# Patient Record
Sex: Female | Born: 1980 | Race: Black or African American | Hispanic: No | Marital: Single | Smoking: Never smoker
Health system: Southern US, Community
[De-identification: ages and names within clinical notes are randomized; demographics above are authoritative.]

## PROBLEM LIST (undated history)

## (undated) DIAGNOSIS — R569 Unspecified convulsions: Secondary | ICD-10-CM

---

## 2017-04-07 ENCOUNTER — Emergency Department (HOSPITAL_COMMUNITY)
Admission: EM | Admit: 2017-04-07 | Discharge: 2017-04-07 | Disposition: A | Discharge status: Home / Self Care | Payer: Medicare Other | Attending: Emergency Medicine | Admitting: Emergency Medicine

## 2017-04-07 ENCOUNTER — Emergency Department (HOSPITAL_COMMUNITY): Payer: Medicare Other

## 2017-04-07 DIAGNOSIS — Z791 Long term (current) use of non-steroidal anti-inflammatories (NSAID): Secondary | ICD-10-CM | POA: Diagnosis not present

## 2017-04-07 DIAGNOSIS — Z79899 Other long term (current) drug therapy: Secondary | ICD-10-CM | POA: Insufficient documentation

## 2017-04-07 DIAGNOSIS — R569 Unspecified convulsions: Secondary | ICD-10-CM | POA: Insufficient documentation

## 2017-04-07 LAB — CBC WITH DIFFERENTIAL/PLATELET
BASOS ABS: 0 10*3/uL (ref 0.0–0.1)
Basophils Relative: 0 %
Eosinophils Absolute: 0.3 10*3/uL (ref 0.0–0.7)
Eosinophils Relative: 5 %
HEMATOCRIT: 38.1 % (ref 36.0–46.0)
HEMOGLOBIN: 12 g/dL (ref 12.0–15.0)
LYMPHS ABS: 1.6 10*3/uL (ref 0.7–4.0)
LYMPHS PCT: 25 %
MCH: 22.1 pg — ABNORMAL LOW (ref 26.0–34.0)
MCHC: 31.5 g/dL (ref 30.0–36.0)
MCV: 70 fL — ABNORMAL LOW (ref 78.0–100.0)
Monocytes Absolute: 0.4 10*3/uL (ref 0.1–1.0)
Monocytes Relative: 6 %
NEUTROS ABS: 4 10*3/uL (ref 1.7–7.7)
Neutrophils Relative %: 64 %
Platelets: 249 10*3/uL (ref 150–400)
RBC: 5.44 MIL/uL — AB (ref 3.87–5.11)
RDW: 16 % — ABNORMAL HIGH (ref 11.5–15.5)
WBC: 6.2 10*3/uL (ref 4.0–10.5)

## 2017-04-07 LAB — COMPREHENSIVE METABOLIC PANEL
ALK PHOS: 72 U/L (ref 38–126)
ALT: 24 U/L (ref 14–54)
AST: 28 U/L (ref 15–41)
Albumin: 3.3 g/dL — ABNORMAL LOW (ref 3.5–5.0)
Anion gap: 5 (ref 5–15)
BILIRUBIN TOTAL: 0.5 mg/dL (ref 0.3–1.2)
BUN: 9 mg/dL (ref 6–20)
CALCIUM: 8.6 mg/dL — AB (ref 8.9–10.3)
CHLORIDE: 107 mmol/L (ref 101–111)
CO2: 23 mmol/L (ref 22–32)
CREATININE: 0.55 mg/dL (ref 0.44–1.00)
Glucose, Bld: 92 mg/dL (ref 65–99)
Potassium: 3.7 mmol/L (ref 3.5–5.1)
Sodium: 135 mmol/L (ref 135–145)
Total Protein: 6.4 g/dL — ABNORMAL LOW (ref 6.5–8.1)

## 2017-04-07 LAB — PHENOBARBITAL LEVEL: Phenobarbital: 21.2 ug/mL (ref 15.0–30.0)

## 2017-04-07 LAB — I-STAT BETA HCG BLOOD, ED (MC, WL, AP ONLY)

## 2017-04-07 LAB — CBG MONITORING, ED: GLUCOSE-CAPILLARY: 81 mg/dL (ref 65–99)

## 2017-04-07 MED ORDER — GABAPENTIN 300 MG PO CAPS
300.0000 mg | ORAL_CAPSULE | Freq: Once | ORAL | Status: AC
  Administered 2017-04-07: 300 mg via ORAL
  Filled 2017-04-07: qty 1

## 2017-04-07 MED ORDER — LORAZEPAM 2 MG/ML IJ SOLN
4.0000 mg | Freq: Once | INTRAMUSCULAR | Status: AC
  Administered 2017-04-07: 4 mg via INTRAVENOUS

## 2017-04-07 MED ORDER — SODIUM CHLORIDE 0.9 % IV BOLUS (SEPSIS)
1000.0000 mL | Freq: Once | INTRAVENOUS | Status: AC
  Administered 2017-04-07: 1000 mL via INTRAVENOUS

## 2017-04-07 MED ORDER — LORAZEPAM 2 MG/ML IJ SOLN
2.0000 mg | Freq: Once | INTRAMUSCULAR | Status: AC
  Administered 2017-04-07: 2 mg via INTRAVENOUS

## 2017-04-07 MED ORDER — ACETAMINOPHEN 325 MG PO TABS
650.0000 mg | ORAL_TABLET | Freq: Once | ORAL | Status: AC
  Administered 2017-04-07: 650 mg via ORAL
  Filled 2017-04-07: qty 2

## 2017-04-07 NOTE — ED Notes (Signed)
Patient was riding in a car when seizure started.  Lasted for approx 30 min prior to arrival.  Hx of sarcoidosis and seizures on meds for such.  Friends/family endorse increased swelling to brain from prior doctors visit.  Patient was shaking and having seizure proir to admin of 6mg  ativan with D. Liu at bedside.  Patient is now able to respond in short sentences with 2L nasal cannula on.  Not confused at baseline.

## 2017-04-07 NOTE — Consult Note (Signed)
Neurology Consultation Reason for Consult: Seizures Referring Physician: Verdie Mosher, D  CC: Seizures  History is obtained from: Patient, family  HPI: Joann Newman is a 36 y.o. female with a history of epilepsy as well as nonepileptic seizures who presents with recurrent seizures that happened earlier today. He reports that she has between 1 and 4 seizures per week, and that it is not uncommon for her to have recurrent seizures. When they last longer than 15 minutes as they bring her to the emergency department.  Today, she had recurrent seizures without return to baseline lasting almost a half an hour and therefore they brought her to the emergency department where she had another seizure. She was given Ativan and has since become more cognizant and  Following commands.  Family reports that she seems very much as she typically does and her postictal state, and she describes some left-sided numbness which she says is normal for her after a seizure.  ROS: A 14 point ROS was performed and is negative except as noted in the HPI.   Past medical history): Seizures ? History of pseudoseizures Sarcoidosis Migraines Hypertension  Medications at the time of discharge from [facility] which she reports has not changed(copied from [facility] discahrge summary): amitriptyline (ELAVIL) 25 MG tablet  Indications: Migraine Prevention, headache Take 25-50 mg by mouth nightly.   1 01/10/2017   citalopram (CELEXA) 40 MG tablet  Take 40 mg by mouth daily.  0 03/04/2017   cyclobenzaprine (FLEXERIL) 10 MG tablet  Take 10 mg by mouth Three (3) times a day as needed for muscle spasms.  0 03/05/2017   gabapentin (NEURONTIN) 300 MG capsule  Take 300 mg by mouth Three (3) times a day.  2 03/09/2017   levETIRAcetam (KEPPRA) 1000 MG tablet  Take 2,000 mg by mouth Two (2) times a day. Dose verified with pharmacy  3 03/04/2017   meloxicam (MOBIC) 15 MG tablet  Take 15 mg by mouth daily with breakfast.  0  03/04/2017   rOPINIRole (REQUIP) 1 MG tablet  Take 1 mg by mouth nightly.  2 03/09/2017   VIMPAT 200 mg tablet  Take 200 mg by mouth Two (2) times a day.  3 02/20/2017   PHENobarbital (LUMINAL) 64.8 MG tablet  Take 1 tablet (65 mg total) by mouth Two (2) times a day. 60 tablet  1 03/23/2017 04/22/2017  oxyCODONE-acetaminophen (PERCOCET) 5-325 mg per tablet  Take 1 tablet by mouth Every six (6) hours. for 5 days 15 tablet  0 03/23/2017 03/28/2017     FHx: Seizures   Social History: Recently moved from [place], used to see [facility] for her medical care, recently discharged from [facility]. She has an outpatient neurologist who is still managing her medications.  Exam: Current vital signs: BP 114/76   Pulse 95   Resp 18   LMP  (LMP Unknown) Comment: LMP unknown due to pt being in unconscious state at the time of x-rays  SpO2 100%  Vital signs in last 24 hours: Pulse Rate:  [92-98] 95 (06/24 1545) Resp:  [16-23] 18 (06/24 1730) BP: (84-132)/(35-106) 114/76 (06/24 1730) SpO2:  [99 %-100 %] 100 % (06/24 1545)   Physical Exam  Constitutional: Appears obese Psych: Affect appropriate to situation Eyes: No scleral injection HENT: No OP obstrucion Head: Normocephalic.  Cardiovascular: Normal rate and regular rhythm.  Respiratory: Effort normal and breath sounds normal to anterior ascultation GI: Soft.  No distension. There is no tenderness.  Skin: WDI  Neuro: Mental Status: Patient is  somnolent but easily arousable, no signs of aphasia or neglect.  Cranial Nerves: II: Visual Fields are full. Pupils are equal, round, and reactive to light.   III,IV, VI: EOMI without ptosis or diploplia.  V: Facial sensation is symmetric to temperature VII: Facial movement is symmetric.  VIII: hearing is intact to voice X: Uvula elevates symmetrically XI: Shoulder shrug is symmetric. XII: tongue is midline without atrophy or fasciculations.  Motor: Tone is normal. Bulk is normal. 5/5 strength  was present on the right, possible mild 4+/5 weakness on the left.  Sensory: decrease Cerebellar: FNF  intact bilaterally   I have reviewed labs in epic and the results pertinent to this consultation are: PHB 21.2  Impression: 36 year old female with a history of seizures with breakthrough seizure flurry today. With Ativan, she has ceased her seizure flurry. She has a fairly maximal doses of multiple medications, I would not favor making any changes at this time but would rather have her discuss further medication changes with her primary neurologist.  Recommendations: 1)  no medication changes at this time 2) as long as she continues to improve to baseline, she can be discharged to follow up with her primary neurologist.   Ritta SlotMcNeill Kirkpatrick, MD [facility] [phone]  If 7pm- 7am, please page neurology on call as listed in [facility].

## 2017-04-07 NOTE — ED Notes (Signed)
Patient's realitive Pashun Judkins would like to be called with any change in condition and at discharge/admit  Phone: [phone]

## 2017-04-07 NOTE — Discharge Instructions (Signed)
Please follow-up with Dr. Ninetta LightsMacDonald from [facility] before she returns to [place]. Please continue seizure medications as prescribed. Return for worsening symptoms.

## 2017-04-07 NOTE — ED Notes (Addendum)
Requesting to take home meds which consist of percocet, gabapentin, and celexa.  Asked patient to wait until she gets home to take meds due to drowsiness.

## 2017-04-07 NOTE — ED Provider Notes (Addendum)
7:55 PM Patient is sleepy, easily arousable to verbal stimulus. Gait is unsteady.   Doug SouJacubowitz, Pat Sires, MD 04/07/17 1957 9:10 PM complained of mild diffuse headache. Tylenol ordered.   At 9:50 PM he continues to complain of headache however feels ready to go home. She is alert and ambulates unassisted. She'll be driven home by family member    Doug SouJacubowitz, Candus Braud, MD 04/07/17 2158    Doug SouJacubowitz, Therin Vetsch, MD 04/07/17 2159

## 2017-04-07 NOTE — ED Notes (Signed)
Ambulated in the hall at this time.  Still wobbly on her feet and c/o "black spots".

## 2017-04-07 NOTE — ED Notes (Signed)
Very motivated to walk in the hall at this time. Assisted X 2.  Able to walk abot 10 steps then states "i need to get back in bed before I fall down."

## 2017-04-07 NOTE — ED Notes (Signed)
Attempted to ambulate pt w/ Dr. Shela CommonsJ w/o success.  Pt remains lethargic and cannot maintain her balance.  Will attempt again later.

## 2017-04-07 NOTE — ED Notes (Signed)
Pt continues to be drowsy.

## 2017-04-07 NOTE — ED Provider Notes (Addendum)
[facility]-EMERGENCY DEPT Provider Note   CSN: [id number] Arrival date & time: 04/07/17  1226     History   Chief Complaint Chief Complaint  Patient presents with  . Seizures    HPI Jacelyn GripJessica Judkins-Frey is a 36 y.o. female.  The history is provided by a relative.   [age] year old female who presents with seizures. History is provided by patient's relative states that while driving in the car 30 minutes prior to arrival she began to have multiple back-to-back seizures lasting for several minutes without clear return to baseline. They report that she was recently [facility]. State that her medications were changed. They do report that she has had increased migraine headaches over the past 2 weeks. They have noticed increasing breakthrough seizures during this time as well. Has had recent diarrhea over the past 1-2 days with mild cough. No current fevers or chills. No nausea or vomiting. She reports that she has been compliant with her medications. Currently visiting from [place] where she receives most of her care.   No past medical history on file.  There are no active problems to display for this patient.   No past surgical history on file.  OB History    No data available       Home Medications    Prior to Admission medications   Medication Sig Start Date End Date Taking? Authorizing Provider  acetaminophen (TYLENOL) 500 MG tablet Take 500-1,000 mg by mouth every 6 (six) hours as needed for headache (pain).   Yes [provider]  albuterol (PROVENTIL HFA;VENTOLIN HFA) 108 (90 Base) MCG/ACT inhaler Inhale 2 puffs into the lungs every 6 (six) hours as needed for wheezing or shortness of breath.   Yes [provider]  amitriptyline (ELAVIL) 25 MG tablet Take 50 mg by mouth at bedtime.   Yes [provider]  Cholecalciferol (VITAMIN D PO) Take 1 tablet by mouth daily.   Yes [provider]    citalopram (CELEXA) 40 MG tablet Take 40 mg by mouth daily at 12 noon.   Yes [provider]  diclofenac (VOLTAREN) 50 MG EC tablet Take 50 mg by mouth every 8 (eight) hours as needed (pain).   Yes [provider]  gabapentin (NEURONTIN) 300 MG capsule Take 300 mg by mouth 3 (three) times daily.   Yes [provider]  ibuprofen (ADVIL,MOTRIN) 200 MG tablet Take 200-400 mg by mouth every 6 (six) hours as needed for headache (pain).   Yes [provider]  lacosamide (VIMPAT) 200 MG TABS tablet Take 200 mg by mouth 2 (two) times daily.   Yes [provider]  levETIRAcetam (KEPPRA) 1000 MG tablet Take 2,000 mg by mouth 2 (two) times daily.   Yes [provider]  meloxicam (MOBIC) 15 MG tablet Take 15 mg by mouth daily at 12 noon.   Yes [provider]  oxyCODONE-acetaminophen (PERCOCET/ROXICET) 5-325 MG tablet Take 0.25-1 tablets by mouth every 6 (six) hours as needed (pain).   Yes [provider]  PHENobarbital (LUMINAL) 64.8 MG tablet Take 64.8 mg by mouth See admin instructions. Take 1 tablet (64.8 mg) by mouth twice daily - morning and noon   Yes [provider]  rOPINIRole (REQUIP) 1 MG tablet Take 1 mg by mouth at bedtime.   Yes [provider]  VITAMIN E PO Take 1 tablet by mouth daily.   Yes [provider]  ciprofloxacin (CIPRO) 500 MG tablet  Take 500 mg by mouth 2 (two) times daily. #20 filled 03/29/17    [provider]  cyclobenzaprine (FLEXERIL) 10 MG tablet Take 10 mg by mouth 3 (three) times daily as needed for muscle spasms. #30 filled 03/05/17    [provider]  topiramate (TOPAMAX) 50 MG tablet Take 50 mg by mouth 2 (two) times daily.    [provider]    Family History No family history on file.  Social History Social History  Substance Use Topics  . Smoking status: Not on file  . Smokeless tobacco: Not on file  . Alcohol use Not on file      Allergies   Bactrim [sulfamethoxazole-trimethoprim]; Coconut flavor; Mushroom extract complex; and Pomegranate [punica]   Review of Systems Review of Systems  Constitutional: Negative for fever.  Cardiovascular: Negative for chest pain.  Musculoskeletal: Negative for neck pain.  Allergic/Immunologic: Negative for immunocompromised state.  Neurological: Positive for seizures and headaches.  Hematological: Does not bruise/bleed easily.  All other systems reviewed and are negative.    Physical Exam Updated Vital Signs BP 114/76   Pulse 95   Resp 18   LMP  (LMP Unknown) Comment: LMP unknown due to pt being in unconscious state at the time of x-rays  SpO2 100%   Physical Exam Physical Exam  Nursing note and vitals reviewed. Constitutional: Sedated, listless, arouses to voice and touch, answers simple questions appropriately Head: Normocephalic and atraumatic.  Mouth/Throat: Oropharynx is clear and moist. Eyes: pupils 4 mm symmetric, reactive to light Neck: Normal range of motion. Neck supple. No nuchal rigidity  Cardiovascular: Normal rate and regular rhythm.   Pulmonary/Chest: Effort normal and breath sounds normal.  Abdominal: Soft. There is no tenderness. There is no rebound and no guarding.  Musculoskeletal: Normal range of motion. no deformities Neurological: Somnolent, arouses to voice, answers simple questions appropriately, no facial droop, fluent speech, moves all extremities symmetrically, symmetric bilateral handgrip and bilateral ankle dorsiflexion and plantarflexion. Sensation to light touch grossly in tact. Skin: Skin is warm and dry.  Psychiatric: Cooperative   ED Treatments / Results  Labs (all labs ordered are listed, but only abnormal results are displayed) Labs Reviewed  CBC WITH DIFFERENTIAL/PLATELET - Abnormal; Notable for the following:       Result Value   RBC 5.44 (*)    MCV 70.0 (*)    MCH 22.1 (*)    RDW 16.0 (*)    All other components  within normal limits  COMPREHENSIVE METABOLIC PANEL - Abnormal; Notable for the following:    Calcium 8.6 (*)    Total Protein 6.4 (*)    Albumin 3.3 (*)    All other components within normal limits  PHENOBARBITAL LEVEL  URINALYSIS, ROUTINE W REFLEX MICROSCOPIC  CBG MONITORING, ED  I-STAT BETA HCG BLOOD, ED ([facility], [facility], [facility] ONLY)    EKG  EKG Interpretation  Date/Time:  Sunday April 07 2017 12:28:23 EDT Ventricular Rate:  97 PR Interval:    QRS Duration: 80 QT Interval:  361 QTC Calculation: 459 R Axis:   46 Text Interpretation:  Sinus rhythm Borderline T wave abnormalities no previous EKG  Confirmed by Crista Curb [phone]) on 04/07/2017 2:34:12 PM       Radiology Dg Chest Portable 1 View  Result Date: 04/07/2017 CLINICAL DATA:  Cough and fever EXAM: PORTABLE CHEST 1 VIEW COMPARISON:  None available FINDINGS: Very low lung volumes with basilar atelectasis. Heart appears enlarged with central vascular congestion but suspect this is secondary  to poor inspiration. Negative for edema, large effusion or pneumothorax. Trachea is midline. No osseous abnormality. IMPRESSION: Low volume exam with basilar atelectasis. Electronically Signed   By: Judie Petit.  Shick M.D.   On: 04/07/2017 13:38    Procedures Procedures (including critical care time)  Medications Ordered in ED Medications  gabapentin (NEURONTIN) capsule 300 mg (not administered)  LORazepam (ATIVAN) injection 2 mg (2 mg Intravenous Given 04/07/17 1232)  LORazepam (ATIVAN) injection 4 mg (4 mg Intravenous Given 04/07/17 1232)  sodium chloride 0.9 % bolus 1,000 mL (1,000 mLs Intravenous New Bag/Given 04/07/17 1321)     Initial Impression / Assessment and Plan / ED Course  I have reviewed the triage vital signs and the nursing notes.  Pertinent labs & imaging results that were available during my care of the patient were reviewed by me and considered in my medical decision making (see chart for details).     Outside records obtained from  [facility] and records reviewed. Patient admitted 6/4 for possible status epilepticus. She was not intubated, but briefly on an Ativan drip. Had a CT head that was unremarkable. I had EEG attempted, but discontinued due to migraine headaches. Seizure medications were changed, and was taken off of Dilantin. She is currently taking Keppra, phenobarbital, and Vimpat. Neurologist is see her at [facility], and there was concern for possible nonepileptic seizures which she has had before. 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  On arrival to ED, patient received 2 mg ativan by nurses with persistent reported seizure like activity. Given additional 4 mg ativan, for tonic clonic like activity. There was urinary incontinence. Subsequently came to and has been very sedated. Answers simple questions appropriately, no focal deficits neurologically. Vital signs stable. Blood work overall reassuring.   Discussed with Dr. Amada Jupiter from neurology. He did not feel that she needs admission for seizures especially given 3-4 breakthrough seizures weekly while she was on medications. To await patient to come out of sedation from ativan, and discharge.   Re-evaluation signed out to Dr. Ethelda Chick. If recurrent seizures, Dr. Amada Jupiter recommended ativan and admission.   Final Clinical Impressions(s) / ED Diagnoses   Final diagnoses:  Seizure [facility])    New Prescriptions New Prescriptions   No medications on file     Lavera Guise, MD 04/07/17 1745    Lavera Guise, MD 04/07/17 [phone]

## 2017-04-07 NOTE — ED Notes (Addendum)
Calls out stating i'm having a reaction to the tylenol.  Reports itching. No S/S of reaction noted.   Requesting IV benadryl.  Dr. Shela CommonsJ made aware.

## 2017-04-13 ENCOUNTER — Emergency Department (HOSPITAL_COMMUNITY)
Admission: EM | Admit: 2017-04-13 | Discharge: 2017-04-14 | Disposition: A | Discharge status: Home / Self Care | Payer: Medicare Other | Source: Home / Self Care | Attending: Emergency Medicine | Admitting: Emergency Medicine

## 2017-04-13 ENCOUNTER — Encounter (HOSPITAL_COMMUNITY): Payer: Self-pay | Admitting: Emergency Medicine

## 2017-04-13 DIAGNOSIS — I10 Essential (primary) hypertension: Secondary | ICD-10-CM | POA: Insufficient documentation

## 2017-04-13 DIAGNOSIS — R600 Localized edema: Secondary | ICD-10-CM | POA: Insufficient documentation

## 2017-04-13 DIAGNOSIS — Z79899 Other long term (current) drug therapy: Secondary | ICD-10-CM | POA: Insufficient documentation

## 2017-04-13 DIAGNOSIS — G40909 Epilepsy, unspecified, not intractable, without status epilepticus: Secondary | ICD-10-CM | POA: Insufficient documentation

## 2017-04-13 DIAGNOSIS — R2243 Localized swelling, mass and lump, lower limb, bilateral: Secondary | ICD-10-CM | POA: Diagnosis present

## 2017-04-13 DIAGNOSIS — R569 Unspecified convulsions: Secondary | ICD-10-CM

## 2017-04-13 HISTORY — DX: Unspecified convulsions: R56.9

## 2017-04-13 NOTE — ED Provider Notes (Signed)
[facility]-EMERGENCY DEPT Provider Note   CSN: [id number] Arrival date & time: 04/13/17  2357  By signing my name below, I, [name], attest that this documentation has been prepared under the direction and in the presence of [name], MD . Electronically Signed: [name], Scribe. 04/14/2017. 12:12 AM.  History   Chief Complaint Chief Complaint  Patient presents with  . Seizures    HPI Comments Joann Newman is a 36 y.o. female with a PMHx significant for HTN and epilepsy, possible status epilepticus, who presents to the Emergency Department actively seizing.  She was brought from home, reportedly unresponsive on arrival.  She was seen in the ED on 04/07/2017, x1wk ago.  At that time, she reported a medication regimen of Keppra, phenobarbital, and Vimpat, experiencing 3-4 breakthrough seizures weekly.  LEVEL 5 CAVEAT DUE TO PT'S CURRENT CONDITION.   The history is provided by medical records. The history is limited by the condition of the patient. No language interpreter was used.    Past Medical History:  Diagnosis Date  . Seizures (HCC)     There are no active problems to display for this patient.   History reviewed. No pertinent surgical history.  OB History    No data available       Home Medications    Prior to Admission medications   Medication Sig Start Date End Date Taking? Authorizing Provider  acetaminophen (TYLENOL) 500 MG tablet Take 500-1,000 mg by mouth every 6 (six) hours as needed for headache (pain).    [provider]  albuterol (PROVENTIL HFA;VENTOLIN HFA) 108 (90 Base) MCG/ACT inhaler Inhale 2 puffs into the lungs every 6 (six) hours as needed for wheezing or shortness of breath.    [provider]  amitriptyline (ELAVIL) 25 MG tablet Take 50 mg by mouth at bedtime.    [provider]  Cholecalciferol (VITAMIN D PO) Take 1 tablet by mouth daily.    [provider]  ciprofloxacin (CIPRO) 500 MG  tablet Take 500 mg by mouth 2 (two) times daily. #20 filled 03/29/17    [provider]  citalopram (CELEXA) 40 MG tablet Take 40 mg by mouth daily at 12 noon.    [provider]  cyclobenzaprine (FLEXERIL) 10 MG tablet Take 10 mg by mouth 3 (three) times daily as needed for muscle spasms. #30 filled 03/05/17    [provider]  diclofenac (VOLTAREN) 50 MG EC tablet Take 50 mg by mouth every 8 (eight) hours as needed (pain).    [provider]  gabapentin (NEURONTIN) 300 MG capsule Take 300 mg by mouth 3 (three) times daily.    [provider]  ibuprofen (ADVIL,MOTRIN) 200 MG tablet Take 200-400 mg by mouth every 6 (six) hours as needed for headache (pain).    [provider]  lacosamide (VIMPAT) 200 MG TABS tablet Take 200 mg by mouth 2 (two) times daily.    [provider]  levETIRAcetam (KEPPRA) 1000 MG tablet Take 2,000 mg by mouth 2 (two) times daily.    [provider]  meloxicam (MOBIC) 15 MG tablet Take 15 mg by mouth daily at 12 noon.    [provider]  oxyCODONE-acetaminophen (PERCOCET/ROXICET) 5-325 MG tablet Take 0.25-1 tablets by mouth every 6 (six) hours as needed (pain).    [provider]  PHENobarbital (LUMINAL) 64.8 MG tablet Take 64.8 mg by mouth See admin instructions. Take 1 tablet (64.8 mg) by mouth twice daily - morning and noon  [provider]  rOPINIRole (REQUIP) 1 MG tablet Take 1 mg by mouth at bedtime.    [provider]  topiramate (TOPAMAX) 50 MG tablet Take 50 mg by mouth 2 (two) times daily.    [provider]  VITAMIN E PO Take 1 tablet by mouth daily.    [provider]    Family History No family history on file.  Social History Social History  Substance Use Topics  . Smoking status: Not on file  . Smokeless tobacco: Not on file  . Alcohol use Not on file     Allergies   Bactrim [sulfamethoxazole-trimethoprim]; Coconut flavor;  Mushroom extract complex; and Pomegranate [punica]   Review of Systems Review of Systems  Unable to perform ROS: Patient unresponsive   Physical Exam Updated Vital Signs BP (!) 142/92 (BP Location: Left Arm)   Pulse 96   Temp 98.4 F (36.9 C) (Tympanic)   Resp (!) 23   Ht 5\' 7"  (1.702 m)   LMP  (LMP Unknown)   SpO2 98%   Physical Exam  Constitutional: She appears well-developed and well-nourished.  Unresponsive with low amplitude seizure activity present.   HENT:  Head: Normocephalic and atraumatic.  Eyes: Pupils are equal, round, and reactive to light.  Neck: Normal range of motion. Neck supple. No JVD present.  Cardiovascular: Normal rate, regular rhythm and normal heart sounds.   No murmur heard. Pulmonary/Chest: Effort normal and breath sounds normal. She has no wheezes. She has no rales. She exhibits no tenderness.  Abdominal: Soft. Bowel sounds are normal. She exhibits no distension and no mass. There is no tenderness.  Musculoskeletal:  1+ pretibial edema. 2-3+ pedal edema  Lymphadenopathy:    She has no cervical adenopathy.  Neurological:  Unresponsive with low amplitude seizure activity involving hands and face.   Skin: Skin is warm and dry. No rash noted.  Nursing note and vitals reviewed.  ED Treatments / Results  DIAGNOSTIC STUDIES: Oxygen Saturation is 98% on room air, normal by my interpretation.    Labs (all labs ordered are listed, but only abnormal results are displayed) Labs Reviewed  BASIC METABOLIC PANEL - Abnormal; Notable for the following:       Result Value   Potassium 3.3 (*)    Calcium 8.6 (*)    All other components within normal limits  CBC WITH DIFFERENTIAL/PLATELET - Abnormal; Notable for the following:    RBC 5.61 (*)    MCV 70.1 (*)    MCH 21.9 (*)    RDW 15.7 (*)    All other components within normal limits    Procedures Procedures (including critical care time) CRITICAL CARE Performed by: GNFAO,ZHYQMGLICK,Lakara Weiland Total critical care  time: 45 minutes Critical care time was exclusive of separately billable procedures and treating other patients. Critical care was necessary to treat or prevent imminent or life-threatening deterioration. Critical care was time spent personally by me on the following activities: development of treatment plan with patient and/or surrogate as well as nursing, discussions with consultants, evaluation of patient's response to treatment, examination of patient, obtaining history from patient or surrogate, ordering and performing treatments and interventions, ordering and review of laboratory studies, ordering and review of radiographic studies, pulse oximetry and re-evaluation of patient's condition.  Medications Ordered in ED Medications  cyclobenzaprine (FLEXERIL) tablet 10 mg (not administered)  LORazepam (ATIVAN) injection 1 mg (1 mg Intravenous Given 04/14/17 0010)  furosemide (LASIX) injection 20 mg (20 mg Intravenous Given 04/14/17 0655)  traMADol Janean Sark(ULTRAM)  tablet 50 mg (50 mg Oral Given 04/14/17 0703)     Initial Impression / Assessment and Plan / ED Course  I have reviewed the triage vital signs and the nursing notes.  Pertinent lab results that were available during my care of the patient were reviewed by me and considered in my medical decision making (see chart for details).  Patient presented unresponsive with seizure activity. Old records are reviewed, and she had been seen in the ED 1 week ago with seizures, and admitted to [facility] 3 weeks ago with status epilepticus. She is given lorazepam 1 mg intravenously, and seizure has stopped. She remains very somnolent.  Family has arrived and stated seizure started just before they got to the hospital. She was asked to come into hospital because of problems with leg cramps. She is visiting from [place], and had run out of her diclofenac 50 mg 3 times a day, and cyclobenzaprine 10 mg 3 times a day. Apparently, in the past, seizures have been  precipitated by pain from leg cramps, cyclobenzaprine and diclofenac have been effective at preventing that. Will check electrolytes. She will be observed in the ED. When she is no longer somnolent, anticipate sending her home with prescriptions for cyclobenzaprine and diclofenac. Family also states that she has had a harsh cough and they're worried about her having walking pneumonia. She does have history of sarcoidosis.  She was in the emergency department about 5 hours before CT started to wake up. At this point, she is so alert and oriented. She is complaining of pain in her feet from swelling and it is noted that she does have significant edema. She is given new prescriptions for diclofenac and cyclobenzaprine. It is also noted that she is running low on phenobarbital, topiramate, and oxycodone-acetaminophen. She is given prescriptions for 1 month supply of phenobarbital and topiramate, and for 15 oxycodone-acetaminophen. She is given a dose of furosemide here and given a prescription for 2 weeks supply. She is to follow-up with her physician in [place] as in his she returns there.  Final Clinical Impressions(s) / ED Diagnoses   Final diagnoses:  Seizure (HCC)    New Prescriptions New Prescriptions   FUROSEMIDE (LASIX) 20 MG TABLET    Take 1 tablet (20 mg total) by mouth daily.   I personally performed the services described in this documentation, which was scribed in my presence. The recorded information has been reviewed and is accurate.     [name], MD 04/14/17 [phone]

## 2017-04-14 ENCOUNTER — Encounter (HOSPITAL_COMMUNITY): Payer: Self-pay | Admitting: *Deleted

## 2017-04-14 ENCOUNTER — Emergency Department (HOSPITAL_COMMUNITY): Payer: Medicare Other

## 2017-04-14 ENCOUNTER — Emergency Department (HOSPITAL_COMMUNITY)
Admission: EM | Admit: 2017-04-14 | Discharge: 2017-04-14 | Disposition: A | Discharge status: Home / Self Care | Payer: Medicare Other | Attending: Emergency Medicine | Admitting: Emergency Medicine

## 2017-04-14 DIAGNOSIS — R609 Edema, unspecified: Secondary | ICD-10-CM

## 2017-04-14 LAB — CBC WITH DIFFERENTIAL/PLATELET
BASOS ABS: 0 10*3/uL (ref 0.0–0.1)
BASOS PCT: 0 %
BASOS PCT: 0 %
Basophils Absolute: 0 10*3/uL (ref 0.0–0.1)
EOS ABS: 0.3 10*3/uL (ref 0.0–0.7)
EOS ABS: 0.4 10*3/uL (ref 0.0–0.7)
Eosinophils Relative: 6 %
Eosinophils Relative: 6 %
HCT: 37.7 % (ref 36.0–46.0)
HEMATOCRIT: 39.3 % (ref 36.0–46.0)
HEMOGLOBIN: 12.3 g/dL (ref 12.0–15.0)
Hemoglobin: 12.4 g/dL (ref 12.0–15.0)
LYMPHS ABS: 1.3 10*3/uL (ref 0.7–4.0)
LYMPHS ABS: 1.6 10*3/uL (ref 0.7–4.0)
LYMPHS PCT: 22 %
Lymphocytes Relative: 23 %
MCH: 21.9 pg — AB (ref 26.0–34.0)
MCH: 22.5 pg — AB (ref 26.0–34.0)
MCHC: 31.3 g/dL (ref 30.0–36.0)
MCHC: 32.9 g/dL (ref 30.0–36.0)
MCV: 68.3 fL — ABNORMAL LOW (ref 78.0–100.0)
MCV: 70.1 fL — AB (ref 78.0–100.0)
MONO ABS: 0.6 10*3/uL (ref 0.1–1.0)
Monocytes Absolute: 0.7 10*3/uL (ref 0.1–1.0)
Monocytes Relative: 9 %
Monocytes Relative: 10 %
NEUTROS ABS: 3.5 10*3/uL (ref 1.7–7.7)
NEUTROS PCT: 62 %
Neutro Abs: 4.3 10*3/uL (ref 1.7–7.7)
Neutrophils Relative %: 62 %
PLATELETS: 193 10*3/uL (ref 150–400)
Platelets: 205 10*3/uL (ref 150–400)
RBC: 5.52 MIL/uL — ABNORMAL HIGH (ref 3.87–5.11)
RBC: 5.61 MIL/uL — AB (ref 3.87–5.11)
RDW: 15.6 % — AB (ref 11.5–15.5)
RDW: 15.7 % — AB (ref 11.5–15.5)
WBC: 5.7 10*3/uL (ref 4.0–10.5)
WBC: 7 10*3/uL (ref 4.0–10.5)

## 2017-04-14 LAB — COMPREHENSIVE METABOLIC PANEL
ALBUMIN: 3.7 g/dL (ref 3.5–5.0)
ALK PHOS: 75 U/L (ref 38–126)
ALT: 24 U/L (ref 14–54)
ANION GAP: 8 (ref 5–15)
AST: 20 U/L (ref 15–41)
BUN: 8 mg/dL (ref 6–20)
CALCIUM: 8.7 mg/dL — AB (ref 8.9–10.3)
CO2: 27 mmol/L (ref 22–32)
Chloride: 104 mmol/L (ref 101–111)
Creatinine, Ser: 0.48 mg/dL (ref 0.44–1.00)
GFR calc non Af Amer: >60 mL/min (ref >60)
GLUCOSE: 85 mg/dL (ref 65–99)
POTASSIUM: 4 mmol/L (ref 3.5–5.1)
SODIUM: 139 mmol/L (ref 135–145)
TOTAL PROTEIN: 7 g/dL (ref 6.5–8.1)
Total Bilirubin: 0.6 mg/dL (ref 0.3–1.2)

## 2017-04-14 LAB — BRAIN NATRIURETIC PEPTIDE: B Natriuretic Peptide: 5.9 pg/mL (ref 0.0–100.0)

## 2017-04-14 LAB — I-STAT TROPONIN, ED: TROPONIN I, POC: 0 ng/mL (ref 0.00–0.08)

## 2017-04-14 LAB — BASIC METABOLIC PANEL
Anion gap: 8 (ref 5–15)
BUN: 8 mg/dL (ref 6–20)
CO2: 24 mmol/L (ref 22–32)
Calcium: 8.6 mg/dL — ABNORMAL LOW (ref 8.9–10.3)
Chloride: 105 mmol/L (ref 101–111)
Creatinine, Ser: 0.56 mg/dL (ref 0.44–1.00)
GFR calc Af Amer: >60 mL/min (ref >60)
GFR calc non Af Amer: >60 mL/min (ref >60)
Glucose, Bld: 95 mg/dL (ref 65–99)
POTASSIUM: 3.3 mmol/L — AB (ref 3.5–5.1)
SODIUM: 137 mmol/L (ref 135–145)

## 2017-04-14 LAB — I-STAT BETA HCG BLOOD, ED (MC, WL, AP ONLY)

## 2017-04-14 MED ORDER — TOPIRAMATE 50 MG PO TABS: 50.0000 mg | ORAL_TABLET | Freq: Two times a day (BID) | ORAL | 0 refills | Status: DC

## 2017-04-14 MED ORDER — MORPHINE SULFATE (PF) 2 MG/ML IV SOLN
4.0000 mg | Freq: Once | INTRAVENOUS | Status: AC
  Administered 2017-04-14: 4 mg via INTRAVENOUS
  Filled 2017-04-14: qty 2

## 2017-04-14 MED ORDER — ONDANSETRON 4 MG PO TBDP
4.0000 mg | ORAL_TABLET | Freq: Once | ORAL | Status: AC
  Administered 2017-04-14: 4 mg via ORAL
  Filled 2017-04-14: qty 1

## 2017-04-14 MED ORDER — PHENOBARBITAL 64.8 MG PO TABS: 64.8000 mg | ORAL_TABLET | ORAL | 0 refills | Status: AC

## 2017-04-14 MED ORDER — DIPHENHYDRAMINE HCL 50 MG/ML IJ SOLN
25.0000 mg | Freq: Once | INTRAMUSCULAR | Status: AC
  Administered 2017-04-14: 25 mg via INTRAVENOUS
  Filled 2017-04-14: qty 1

## 2017-04-14 MED ORDER — KETOROLAC TROMETHAMINE 30 MG/ML IJ SOLN
30.0000 mg | Freq: Once | INTRAMUSCULAR | Status: AC
  Administered 2017-04-14: 30 mg via INTRAVENOUS
  Filled 2017-04-14: qty 1

## 2017-04-14 MED ORDER — LORAZEPAM 2 MG/ML IJ SOLN
1.0000 mg | Freq: Once | INTRAMUSCULAR | Status: AC
  Administered 2017-04-14: 1 mg via INTRAVENOUS
  Filled 2017-04-14: qty 1

## 2017-04-14 MED ORDER — CYCLOBENZAPRINE HCL 10 MG PO TABS
10.0000 mg | ORAL_TABLET | Freq: Once | ORAL | Status: AC
  Administered 2017-04-14: 10 mg via ORAL
  Filled 2017-04-14: qty 1

## 2017-04-14 MED ORDER — FUROSEMIDE 40 MG PO TABS
20.0000 mg | ORAL_TABLET | Freq: Once | ORAL | Status: AC
  Administered 2017-04-14: 20 mg via ORAL
  Filled 2017-04-14: qty 1

## 2017-04-14 MED ORDER — FUROSEMIDE 20 MG PO TABS: 20.0000 mg | ORAL_TABLET | Freq: Every day | ORAL | 0 refills | Status: AC

## 2017-04-14 MED ORDER — CYCLOBENZAPRINE HCL 10 MG PO TABS: 10.0000 mg | ORAL_TABLET | Freq: Three times a day (TID) | ORAL | 0 refills | Status: AC | PRN

## 2017-04-14 MED ORDER — OXYCODONE-ACETAMINOPHEN 5-325 MG PO TABS: 0.5000 | ORAL_TABLET | Freq: Four times a day (QID) | ORAL | 0 refills | Status: AC | PRN

## 2017-04-14 MED ORDER — DICLOFENAC SODIUM 50 MG PO TBEC: 50.0000 mg | DELAYED_RELEASE_TABLET | Freq: Three times a day (TID) | ORAL | 0 refills | Status: AC | PRN

## 2017-04-14 MED ORDER — FUROSEMIDE 10 MG/ML IJ SOLN
20.0000 mg | Freq: Once | INTRAMUSCULAR | Status: AC
  Administered 2017-04-14: 20 mg via INTRAVENOUS
  Filled 2017-04-14: qty 2

## 2017-04-14 MED ORDER — TRAMADOL HCL 50 MG PO TABS
50.0000 mg | ORAL_TABLET | Freq: Once | ORAL | Status: AC
  Administered 2017-04-14: 50 mg via ORAL
  Filled 2017-04-14: qty 1

## 2017-04-14 NOTE — ED Triage Notes (Addendum)
Pt demonstrating seizure activity with whimpering and drooling, after several seconds turned pt back onto her back. She immediately stopped seizureactivity. Abl to answer questions without slurred speech or difficulty. Pt did not have any rigidity or incontinence during episode.

## 2017-04-14 NOTE — ED Provider Notes (Signed)
Pt signed out by Dr. Lynelle DoctorKnapp pending labs and CXR.  The pt's labs are ok and CXR did not show pna.  Pt is given a dose of lasix here and told to get her lasix rx that she was given last night filled.  She is also encouraged to use compression hose.  The pt is told to f/u with pcp.  Return if worse.  No further seizures while here.   Jacalyn LefevreHaviland, [place], MD 04/14/17 Mikle [id number]

## 2017-04-14 NOTE — ED Provider Notes (Signed)
[facility]-EMERGENCY DEPT Provider Note   CSN: [id number] Arrival date & time: 04/14/17  1512     History   Chief Complaint Chief Complaint  Patient presents with  . Leg Swelling         HPI Joann Newman is a 36 y.o. female.  HPI Patient presents to the emergency room for evaluation of painful swelling in her bilateral feet and right hand.  Patient also had to have a history of epilepsy.    [facility] June.   . The EMS report, they witnessed the patient having a grand mal seizure at home. It lasted for approximately 5 minutes.  In the emergency room she had additional seizure-like activity at the bedside. The nurse witnessed the patient drooling. The symptoms lasted for several seconds. Patient was immediately able to answer questions without slurred speech or difficulty. He did not have any postictal period here in the emergency room.  Patient states the seizures are not unusual for her. She is complaining of primarily swelling in her bilateral feet. She's also had some swelling in her abdomen and her right hand. Denies any trouble with chest pain. She does have some intermittent shortness of breath. She denies any history of fevers or chills. She has been coughing up mucus.   The patient was ecstasy in the emergency room on June 24 as well as yesterday for seizure-like activity. Past Medical History:  Diagnosis Date  . Seizures (HCC)     There are no active problems to display for this patient.   History reviewed. No pertinent surgical history.  OB History    No data available       Home Medications    Prior to Admission medications   Medication Sig Start Date End Date Taking? Authorizing Provider  albuterol (PROVENTIL HFA;VENTOLIN HFA) 108 (90 Base) MCG/ACT inhaler Inhale 2 puffs into the lungs every 6 (six) hours as needed for wheezing or shortness of breath.   Yes [provider]  amitriptyline (ELAVIL) 25 MG tablet  Take 50 mg by mouth at bedtime.   Yes [provider]  citalopram (CELEXA) 40 MG tablet Take 40 mg by mouth daily.    Yes [provider]  cyclobenzaprine (FLEXERIL) 10 MG tablet Take 1 tablet (10 mg total) by mouth 3 (three) times daily as needed for muscle spasms. #30 filled 03/05/17 04/14/17  Yes Dione BoozeGlick, David, MD  diclofenac (VOLTAREN) 50 MG EC tablet Take 1 tablet (50 mg total) by mouth every 8 (eight) hours as needed (pain). 04/14/17  Yes Dione BoozeGlick, David, MD  diphenhydrAMINE (BENADRYL) 25 MG tablet Take 25 mg by mouth every 6 (six) hours as needed for itching.   Yes [provider]  furosemide (LASIX) 20 MG tablet Take 1 tablet (20 mg total) by mouth daily. 04/14/17  Yes Dione BoozeGlick, David, MD  gabapentin (NEURONTIN) 300 MG capsule Take 300 mg by mouth 3 (three) times daily.   Yes [provider]  lacosamide (VIMPAT) 200 MG TABS tablet Take 200 mg by mouth 2 (two) times daily.   Yes [provider]  levETIRAcetam (KEPPRA) 1000 MG tablet Take 2,000 mg by mouth 2 (two) times daily.   Yes [provider]  meloxicam (MOBIC) 15 MG tablet Take 15 mg by mouth daily.    Yes [provider]  PHENobarbital (LUMINAL) 64.8 MG tablet Take 1 tablet (64.8 mg total) by mouth See admin instructions. Take 1 tablet (64.8 mg) by mouth twice daily -  morning and noon 04/14/17  Yes [name], MD  rOPINIRole (REQUIP) 1 MG tablet Take 1 mg by mouth at bedtime.   Yes [provider]  topiramate (TOPAMAX) 50 MG tablet Take 1 tablet (50 mg total) by mouth 2 (two) times daily. 04/14/17  Yes [name], MD  VITAMIN E SKIN OIL Apply 1 application topically daily.   Yes [provider]  ibuprofen (ADVIL,MOTRIN) 200 MG tablet Take 200-400 mg by mouth every 6 (six) hours as needed for headache (pain).    [provider]  oxyCODONE-acetaminophen (PERCOCET/ROXICET) 5-325 MG tablet Take 0.5-1 tablets by mouth every 6 (six) hours as needed (pain). 04/14/17    [name], MD    Family History No family history on file.  Social History Social History  Substance Use Topics  . Smoking status: Never Smoker  . Smokeless tobacco: Never Used  . Alcohol use Not on file     Allergies   Bactrim [sulfamethoxazole-trimethoprim]; Ceftin [cefuroxime axetil]; Coconut flavor; Mushroom extract complex; and Pomegranate [punica]   Review of Systems Review of Systems  All other systems reviewed and are negative.    Physical Exam Updated Vital Signs BP 110/79 (BP Location: Left Wrist)   Pulse 93   Temp 98.5 F (36.9 C) (Oral)   Resp (!) 24   Ht 1.676 m (5\' 6" )   Wt 127 kg (280 lb)   LMP 03/31/2017 (Approximate)   SpO2 96%   BMI 45.19 kg/m   Physical Exam  Constitutional: No distress.  Morbidly obese  HENT:  Head: Normocephalic and atraumatic.  Right Ear: External ear normal.  Left Ear: External ear normal.  Eyes: Conjunctivae are normal. Right eye exhibits no discharge. Left eye exhibits no discharge. No scleral icterus.  Neck: Neck supple. No tracheal deviation present.  Cardiovascular: Normal rate, regular rhythm and intact distal pulses.   Pulmonary/Chest: Effort normal and breath sounds normal. No stridor. No respiratory distress. She has no wheezes. She has no rales.  Abdominal: Soft. Bowel sounds are normal. She exhibits no distension. There is no tenderness. There is no rebound and no guarding.  Protuberant  Musculoskeletal: She exhibits no edema or tenderness.  No pitting edema noted on exam patient does appear to have swelling in her bilateral feet, trace edema in the right hand, she had an IV in that hand recently, no erythema, no streaking, no induration  Neurological: She is alert. She has normal strength. No cranial nerve deficit (no facial droop, extraocular movements intact, no slurred speech) or sensory deficit. She exhibits normal muscle tone. She displays no seizure activity. Coordination normal.  Skin: Skin is warm  and dry. No rash noted.  Psychiatric: She has a normal mood and affect.  Nursing note and vitals reviewed.    ED Treatments / Results  Labs (all labs ordered are listed, but only abnormal results are displayed) Labs Reviewed  CBC WITH DIFFERENTIAL/PLATELET  COMPREHENSIVE METABOLIC PANEL  BRAIN NATRIURETIC PEPTIDE  I-STAT TROPOININ, ED  I-STAT BETA HCG BLOOD, ED ([facility], [facility], [facility] ONLY)    EKG  EKG Interpretation  Date/Time:  Sunday April 14 2017 15:25:34 EDT Ventricular Rate:  93 PR Interval:    QRS Duration: 87 QT Interval:  366 QTC Calculation: 456 R Axis:   40 Text Interpretation:  Sinus rhythm Borderline T abnormalities, anterior leads No significant change since last tracing Confirmed by Linwood Dibbles [phone]) on 04/14/2017 3:54:28 PM       Radiology Dg Chest 2 View  Result Date: 04/14/2017  CLINICAL DATA:  36 year old female with lethargy, shortness of breath and bilateral lower extremity swelling for 2 days. EXAM: CHEST  2 VIEW COMPARISON:  Portable chest radiographs 0050 hours today and earlier. FINDINGS: Semi upright AP and lateral views of the chest. Cardiac size is at the upper limits of normal to mildly enlarged. Other mediastinal contours are within normal limits. Visualized tracheal air column is within normal limits. No pneumothorax, pulmonary edema, pleural effusion or confluent pulmonary opacity. No acute osseous abnormality identified. Negative visible bowel gas pattern. IMPRESSION: Borderline to mild cardiomegaly.  No acute pulmonary abnormality. Electronically Signed   By: Odessa Fleming M.D.   On: 04/14/2017 16:16   Dg Chest Port 1 View  Result Date: 04/14/2017 CLINICAL DATA:  Cough with seizure EXAM: PORTABLE CHEST 1 VIEW COMPARISON:  04/07/2017 FINDINGS: Low lung volumes with elevated right diaphragm. Subtle asymmetric increased opacity at the left thorax. No pleural effusion. Cardiomegaly. No pneumothorax. IMPRESSION: 1. Subtle asymmetric increased density of the left thorax  could be due to mild diffuse atelectasis or asymmetric edema. 2. There is cardiomegaly Electronically Signed   By: Jasmine Pang M.D.   On: 04/14/2017 01:27    Procedures Procedures (including critical care time)  Medications Ordered in ED Medications  LORazepam (ATIVAN) injection 1 mg (not administered)     Initial Impression / Assessment and Plan / ED Course  I have reviewed the triage vital signs and the nursing notes.  Pertinent labs & imaging results that were available during my care of the patient were reviewed by me and considered in my medical decision making (see chart for details).   Pt presented to the ED with seizures although her complaint is swelling.  Pt does have edema but she is morbidly obese and this may be a contributing factor.  No pitting edema noted on exam.    Will check renal function, lfts, check for proteinuria.  Ativan dose for her seizures.    Dispo pending lab results and reassessment. Will turn over to oncoming MD  Final Clinical Impressions(s) / ED Diagnoses  pending   Linwood Dibbles, MD 04/14/17 1649

## 2017-04-14 NOTE — ED Triage Notes (Signed)
Patient arrived with family from home , presents with arms shaking - history of seizures , respirations unlabored , lethargic/somnolent at arrival .

## 2017-04-14 NOTE — ED Notes (Signed)
Family member states she is "going to smack a bitch"

## 2017-04-14 NOTE — Discharge Instructions (Signed)
Get lasix rx filled that was given to you this morning.  Use compression hose.

## 2017-04-14 NOTE — ED Notes (Addendum)
Pt family at nurses station threatening this RN b/c I have not let her back to see pt. Informed her that staff is getting pt settled and then they will be able to go back and see her. Still threatening this RN, states I have 2 minutes to let her back. Pt states it is her "legal right" to be back there with the pt. Pt family in waiting room cussing.

## 2017-04-14 NOTE — Discharge Instructions (Signed)
Stay on a low salt diet. Return if you are having any problems. °

## 2017-04-14 NOTE — ED Notes (Signed)
Removed IV Rt Hand.

## 2017-04-14 NOTE — ED Notes (Signed)
Joann Newman (daughter) [phone] Joann Newman (son) [phone] Call for updates please

## 2017-04-14 NOTE — ED Triage Notes (Signed)
Per EMS, patient from home, patient sitting upright having grand mal seizure upon EMS arrival. Reports total of 5 seizures lasting approximately 3 minutes. [facility].   5mg  Midazolam IM 22g R Hand  BP 146/110 HR 95 O2 97% 2L

## 2017-04-14 NOTE — ED Notes (Signed)
Pt. refused Tramadol , EDP notified.

## 2017-04-14 NOTE — ED Notes (Signed)
Family notified on pt.'s discharge plan .

## 2017-04-14 NOTE — ED Notes (Signed)
Charge RN speaking to family member with security and [employer].

## 2017-04-21 ENCOUNTER — Other Ambulatory Visit: Payer: Self-pay

## 2017-04-21 ENCOUNTER — Emergency Department (HOSPITAL_COMMUNITY)
Admission: EM | Admit: 2017-04-21 | Discharge: 2017-04-22 | Disposition: A | Discharge status: Home / Self Care | Payer: Medicare Other | Attending: Emergency Medicine | Admitting: Emergency Medicine

## 2017-04-21 DIAGNOSIS — Z79899 Other long term (current) drug therapy: Secondary | ICD-10-CM | POA: Insufficient documentation

## 2017-04-21 DIAGNOSIS — R569 Unspecified convulsions: Secondary | ICD-10-CM | POA: Diagnosis not present

## 2017-04-21 DIAGNOSIS — R0789 Other chest pain: Secondary | ICD-10-CM

## 2017-04-21 NOTE — ED Triage Notes (Addendum)
Pt from homve via [facility]. Per pt family pt had x4 seizures back to back that lasted about 2-93mins each for about 35mins. Hx of seizures and took meds a little late today. Sts she is compliant w/ meds. Pt A&O x4 on arrival. Pt now c/o CP that's worse w/ movement, palpitation & deep inspiration. Has had a non-productive cough x3 wks now. 324 ASA PTA. Rates pain @ 8/10.

## 2017-04-21 NOTE — ED Provider Notes (Signed)
[facility]-EMERGENCY DEPT Provider Note   CSN: [id number] Arrival date & time: 04/21/17  2320     History   Chief Complaint No chief complaint on file.   HPI Joann Newman is a 36 y.o. female.  HPI   36 year old female with history of seizure brought here via EMS for evaluation of chest pain. The patient lives in [place] and she is up here for a visit. She has history of seizures for the past 7 years and currently on multiple medications including Keppra, phenobarbital, and Vimpat which she has been compliant with her medication. Today she had 4 episodes of witnessed seizures by family members last episode was approximately 2 hours ago while she is home. Family member describes generalized tonic-clonic seizure lasting for approximately 3-4 minutes with a brief postictal state. Patient is now back to her baseline. She reported having central chest pain which she described as a sharp stabbing sensation radiates up to her head prior to his seizure episode. The pain still persist and is moderate in severity. She has never had this pain before. She does have a significant family history of cardiac disease. She denies any associated shortness of breath, diaphoresis. She denies tongue biting or urinary incontinence. She did not injure herself from the seizure episode. She did mention that her seizures are not well controlled and would have at least 3-4 episodes per week. No recent medication changes. Denies any alcohol abuse. She does use medical marijuana. She does report occasional stabbing headache prior to her seizures consistence with oral but never chest pain. She however does report having cold symptoms for the past 3 weeks with nonproductive cough. Denies shortness of breath.  Past Medical History:  Diagnosis Date  . Seizures (HCC)     There are no active problems to display for this patient.   No past surgical history on file.  OB History    No data available       Home  Medications    Prior to Admission medications   Medication Sig Start Date End Date Taking? Authorizing Provider  albuterol (PROVENTIL HFA;VENTOLIN HFA) 108 (90 Base) MCG/ACT inhaler Inhale 2 puffs into the lungs every 6 (six) hours as needed for wheezing or shortness of breath.    [provider]  amitriptyline (ELAVIL) 25 MG tablet Take 50 mg by mouth at bedtime.    [provider]  citalopram (CELEXA) 40 MG tablet Take 40 mg by mouth daily.     [provider]  cyclobenzaprine (FLEXERIL) 10 MG tablet Take 1 tablet (10 mg total) by mouth 3 (three) times daily as needed for muscle spasms. #30 filled 03/05/17 04/14/17   [name], MD  diclofenac (VOLTAREN) 50 MG EC tablet Take 1 tablet (50 mg total) by mouth every 8 (eight) hours as needed (pain). 04/14/17   [name], MD  diphenhydrAMINE (BENADRYL) 25 MG tablet Take 25 mg by mouth every 6 (six) hours as needed for itching.    [provider]  furosemide (LASIX) 20 MG tablet Take 1 tablet (20 mg total) by mouth daily. 04/14/17   [name], MD  gabapentin (NEURONTIN) 300 MG capsule Take 300 mg by mouth 3 (three) times daily.    [provider]  ibuprofen (ADVIL,MOTRIN) 200 MG tablet Take 200-400 mg by mouth every 6 (six) hours as needed for headache (pain).    [provider]  lacosamide (VIMPAT) 200 MG TABS tablet Take 200 mg by mouth 2 (two) times daily.    [provider]  levETIRAcetam (KEPPRA) 1000 MG tablet Take 2,000 mg by mouth 2 (two) times daily.    [provider]  meloxicam (MOBIC) 15 MG tablet Take 15 mg by mouth daily.     [provider]  oxyCODONE-acetaminophen (PERCOCET/ROXICET) 5-325 MG tablet Take 0.5-1 tablets by mouth every 6 (six) hours as needed (pain). 04/14/17   Dione BoozeGlick, David, MD  PHENobarbital (LUMINAL) 64.8 MG tablet Take 1 tablet (64.8 mg total) by mouth See admin instructions. Take 1 tablet (64.8 mg) by mouth twice daily - morning and noon  04/14/17   Dione BoozeGlick, David, MD  rOPINIRole (REQUIP) 1 MG tablet Take 1 mg by mouth at bedtime.    [provider]  topiramate (TOPAMAX) 50 MG tablet Take 1 tablet (50 mg total) by mouth 2 (two) times daily. 04/14/17   Dione BoozeGlick, David, MD  VITAMIN E SKIN OIL Apply 1 application topically daily.    [provider]    Family History No family history on file.  Social History Social History  Substance Use Topics  . Smoking status: Never Smoker  . Smokeless tobacco: Never Used  . Alcohol use Not on file     Allergies   Bactrim [sulfamethoxazole-trimethoprim]; Ceftin [cefuroxime axetil]; Coconut flavor; Mushroom extract complex; and Pomegranate [punica]   Review of Systems Review of Systems  All other systems reviewed and are negative.    Physical Exam Updated Vital Signs BP (!) 149/96 (BP Location: Right Wrist)   Pulse 91   Temp 98.1 F (36.7 C) (Oral)   Resp 18   LMP  (LMP Unknown)   SpO2 100%   Physical Exam  Constitutional: She is oriented to person, place, and time. She appears well-developed and well-nourished. No distress.  HENT:  Head: Atraumatic.  No tongue injury  Eyes: Conjunctivae are normal.  Neck: Neck supple. No JVD present.  Cardiovascular: Normal rate and regular rhythm.   Pulmonary/Chest: Effort normal and breath sounds normal. No stridor. No respiratory distress. She has no wheezes. She exhibits tenderness (Tenderness to central anterior chest on palpation without any crepitus or emphysema. Normal skin tone without any signs of infection.).  Abdominal: Soft. She exhibits no distension. There is no tenderness.  Musculoskeletal: She exhibits no edema.  Neurological: She is alert and oriented to person, place, and time. She has normal strength. No cranial nerve deficit or sensory deficit. GCS eye subscore is 4. GCS verbal subscore is 5. GCS motor subscore is 6.  Skin: No rash noted.  Psychiatric: She has a normal mood and affect.  Nursing note and  vitals reviewed.    ED Treatments / Results  Labs (all labs ordered are listed, but only abnormal results are displayed) Labs Reviewed  BASIC METABOLIC PANEL  CBC  URINALYSIS, ROUTINE W REFLEX MICROSCOPIC  RAPID URINE DRUG SCREEN, HOSP PERFORMED  PHENOBARBITAL LEVEL  HEPATIC FUNCTION PANEL  CBG MONITORING, ED  I-STAT TROPOININ, ED  I-STAT BETA HCG BLOOD, ED ([facility], [facility], [facility] ONLY)    EKG  EKG Interpretation  Date/Time:  Monday April 22 2017 00:06:58 EDT Ventricular Rate:  85 PR Interval:    QRS Duration: 93 QT Interval:  355 QTC Calculation: 423 R Axis:   35 Text Interpretation:  Sinus rhythm Nonspecific T abnrm, anterolateral leads No significant change since last tracing Confirmed by Rochele RaringWard, Kristen [id number]) on 04/22/2017 12:13:48 AM       Radiology No results found.  Procedures Procedures (including critical care time)  Medications Ordered in ED Medications  levETIRAcetam (KEPPRA)  1,000 mg in sodium chloride 0.9 % 100 mL IVPB (not administered)  acetaminophen (TYLENOL) tablet 650 mg (not administered)     Initial Impression / Assessment and Plan / ED Course  I have reviewed the triage vital signs and the nursing notes.  Pertinent labs & imaging results that were available during my care of the patient were reviewed by me and considered in my medical decision making (see chart for details).     BP 117/86   Pulse 83   Temp 98.1 F (36.7 C) (Oral)   Resp 18   Ht 5\' 6"  (1.676 m)   Wt 127 kg (280 lb)   LMP 03/21/2017   SpO2 100%   BMI 45.19 kg/m    Final Clinical Impressions(s) / ED Diagnoses   Final diagnoses:  Seizure (HCC)  Chest wall pain    New Prescriptions New Prescriptions   No medications on file   12:10 AM Patient here with report of 4 episodes of seizures activities throughout the day today. She is also complaining of chest pain which is reproducible on exam. She has cold symptoms for the past several weeks therefore I will obtain a chest  x-ray to rule out pneumonia. Her pain is reproducible on exam, low suspicion for ACS causing her pain. EKG and troponin will be ordered. Family member states that she was late in taking her seizure medication today. I will check a Keppra and phenobarbital level however it would not result tonight.  12:19 AM Care discussed with Dr. Elesa Massed.  Plan for pt is to give a loading dose of Keppra 1,000mg  IV. Will check phenobarbital level.  Since pt has persistent cough and reproducible chest pain, CXR ordered.  Pt did have 4 consecutive seizures episodes, therefore we will plan to consult neurologist for recommendation if her labs are unremarkable.  Pt is in the process of moving to [place] from [place].  She does not have an established neurologist in town.  Tylenol given for her headache.    12:27 AM Care discussed with Glenford Bayley, PA-C who will f/u on pt's labs and will consult neurology for recommendation as appropriate.     Fayrene Helper, PA-C 04/22/17 0028    Ward, Layla Maw, DO 04/22/17 [phone]

## 2017-04-22 ENCOUNTER — Emergency Department (HOSPITAL_COMMUNITY): Payer: Medicare Other

## 2017-04-22 LAB — I-STAT BETA HCG BLOOD, ED (MC, WL, AP ONLY)

## 2017-04-22 LAB — HEPATIC FUNCTION PANEL
ALBUMIN: 3.4 g/dL — AB (ref 3.5–5.0)
ALT: 32 U/L (ref 14–54)
AST: 31 U/L (ref 15–41)
Alkaline Phosphatase: 80 U/L (ref 38–126)
BILIRUBIN TOTAL: 0.2 mg/dL — AB (ref 0.3–1.2)
Total Protein: 6.3 g/dL — ABNORMAL LOW (ref 6.5–8.1)

## 2017-04-22 LAB — URINALYSIS, ROUTINE W REFLEX MICROSCOPIC
BILIRUBIN URINE: NEGATIVE
Glucose, UA: NEGATIVE mg/dL
Hgb urine dipstick: NEGATIVE
Ketones, ur: NEGATIVE mg/dL
Nitrite: NEGATIVE
PH: 6 (ref 5.0–8.0)
Protein, ur: NEGATIVE mg/dL
SPECIFIC GRAVITY, URINE: 1.016 (ref 1.005–1.030)

## 2017-04-22 LAB — I-STAT TROPONIN, ED: TROPONIN I, POC: 0 ng/mL (ref 0.00–0.08)

## 2017-04-22 LAB — BASIC METABOLIC PANEL
Anion gap: 7 (ref 5–15)
BUN: 8 mg/dL (ref 6–20)
CHLORIDE: 105 mmol/L (ref 101–111)
CO2: 25 mmol/L (ref 22–32)
Calcium: 8.4 mg/dL — ABNORMAL LOW (ref 8.9–10.3)
Creatinine, Ser: 0.58 mg/dL (ref 0.44–1.00)
GFR calc non Af Amer: >60 mL/min (ref >60)
Glucose, Bld: 101 mg/dL — ABNORMAL HIGH (ref 65–99)
Potassium: 3.7 mmol/L (ref 3.5–5.1)
Sodium: 137 mmol/L (ref 135–145)

## 2017-04-22 LAB — CBC
HCT: 37 % (ref 36.0–46.0)
HEMOGLOBIN: 11.4 g/dL — AB (ref 12.0–15.0)
MCH: 21.6 pg — AB (ref 26.0–34.0)
MCHC: 30.8 g/dL (ref 30.0–36.0)
MCV: 69.9 fL — AB (ref 78.0–100.0)
Platelets: 187 10*3/uL (ref 150–400)
RBC: 5.29 MIL/uL — AB (ref 3.87–5.11)
RDW: 15.5 % (ref 11.5–15.5)
WBC: 6.2 10*3/uL (ref 4.0–10.5)

## 2017-04-22 LAB — RAPID URINE DRUG SCREEN, HOSP PERFORMED
AMPHETAMINES: NOT DETECTED
BARBITURATES: POSITIVE — AB
Benzodiazepines: NOT DETECTED
COCAINE: NOT DETECTED
Opiates: POSITIVE — AB
TETRAHYDROCANNABINOL: POSITIVE — AB

## 2017-04-22 LAB — PHENOBARBITAL LEVEL: Phenobarbital: 21.1 ug/mL (ref 15.0–30.0)

## 2017-04-22 LAB — CBG MONITORING, ED: Glucose-Capillary: 93 mg/dL (ref 65–99)

## 2017-04-22 MED ORDER — ONDANSETRON HCL 4 MG/2ML IJ SOLN
4.0000 mg | Freq: Once | INTRAMUSCULAR | Status: AC
  Administered 2017-04-22: 4 mg via INTRAVENOUS
  Filled 2017-04-22: qty 2

## 2017-04-22 MED ORDER — FLUCONAZOLE 100 MG PO TABS
150.0000 mg | ORAL_TABLET | Freq: Once | ORAL | Status: AC
  Administered 2017-04-22: 150 mg via ORAL
  Filled 2017-04-22: qty 2

## 2017-04-22 MED ORDER — FLUCONAZOLE 150 MG PO TABS: 150.0000 mg | ORAL_TABLET | Freq: Once | ORAL | 0 refills | Status: AC

## 2017-04-22 MED ORDER — MORPHINE SULFATE (PF) 4 MG/ML IV SOLN
4.0000 mg | Freq: Once | INTRAVENOUS | Status: AC
  Administered 2017-04-22: 4 mg via INTRAVENOUS
  Filled 2017-04-22: qty 1

## 2017-04-22 MED ORDER — DIPHENHYDRAMINE HCL 50 MG/ML IJ SOLN
25.0000 mg | Freq: Once | INTRAMUSCULAR | Status: AC
  Administered 2017-04-22: 25 mg via INTRAVENOUS
  Filled 2017-04-22: qty 1

## 2017-04-22 MED ORDER — DIPHENHYDRAMINE HCL 50 MG/ML IJ SOLN
25.0000 mg | Freq: Once | INTRAMUSCULAR | Status: AC
  Administered 2017-04-22: 25 mg via INTRAVENOUS

## 2017-04-22 MED ORDER — FOSFOMYCIN TROMETHAMINE 3 G PO PACK
3.0000 g | PACK | Freq: Once | ORAL | Status: AC
  Administered 2017-04-22: 3 g via ORAL
  Filled 2017-04-22: qty 3

## 2017-04-22 MED ORDER — DIPHENHYDRAMINE HCL 50 MG/ML IJ SOLN
INTRAMUSCULAR | Status: AC
  Filled 2017-04-22: qty 1

## 2017-04-22 MED ORDER — SODIUM CHLORIDE 0.9 % IV SOLN
1000.0000 mg | Freq: Once | INTRAVENOUS | Status: AC
  Administered 2017-04-22: 1000 mg via INTRAVENOUS
  Filled 2017-04-22: qty 10

## 2017-04-22 MED ORDER — ACETAMINOPHEN 325 MG PO TABS
650.0000 mg | ORAL_TABLET | Freq: Once | ORAL | Status: DC
  Filled 2017-04-22: qty 2

## 2017-04-22 NOTE — ED Provider Notes (Signed)
Sign out from Joann HelperBowie Tran, PA-C at shift change  Patient with seizure disorder not well controlled on multiple anticonvulsants. Patient normally has 3-4 seizures per week. Patient presents today after having 4 seizures back to back. Patient also reports a substernal chest pain that is reproducible. Patient has been coughing for 3 weeks. The chest x-ray and labs are pending. Following lab results, plan to consult neurology for plan for medication reconciliation versus need for admission.  Patient with UTI. UA shows large leukocytes, too numerous to count WBCs, 6-30 squamous epithelial cells, and budding yeast. Patient does note that she has had a clumpy vaginal discharge, four odor and itching lately. She denies concern for STD exposure. Otherwise, no other significant findings in the labs to explain increase in seizure activity.  Patient is feeling much better and has not had any seizure activity in the ED. I consulted with neurologist, Dr. Leroy Kennedyamilo, who advised no change in the patient's medications, considering she is at the max dose for L3. He advised treatment of the UTI, as this could be a cause of patient's increased seizure activity. I consulted pharmacist who advised fosfomycin treatment for the patient considering her many medications and medication allergies. Single-dose fosfomycin given in the ED prior to discharge. Patient advised to follow up and establish care with neurology in the area or see her neurologist back home. She is also advised to follow-up with PCP as needed. Return precautions discussed. Patient understands and agrees with plan. Patient vitals stable throughout ED course and discharged in satisfactory condition. Patient also evaluated by Dr. Elesa MassedWard who guided the patient's management and agrees with plan.   [name], Vail Vuncannon M, PA-C 04/22/17 0358    Ward, [name], DO 04/22/17 [id number]

## 2017-04-22 NOTE — Discharge Instructions (Signed)
Medications: Diflucan  Treatment: Only take Diflucan if your symptoms have not resolved after 72 hours.  Follow-up: Please follow-up and establish care with a neurologist in town or see your current neurologist this week for follow-up of today's visit. Please return to the emergency department if you develop any new or worsening symptoms.

## 2017-04-23 ENCOUNTER — Emergency Department (HOSPITAL_COMMUNITY): Payer: Medicare Other

## 2017-04-23 ENCOUNTER — Encounter (HOSPITAL_COMMUNITY): Payer: Self-pay | Admitting: Emergency Medicine

## 2017-04-23 ENCOUNTER — Emergency Department (HOSPITAL_COMMUNITY)
Admission: EM | Admit: 2017-04-23 | Discharge: 2017-04-24 | Disposition: A | Discharge status: Home / Self Care | Payer: Medicare Other | Attending: Emergency Medicine | Admitting: Emergency Medicine

## 2017-04-23 DIAGNOSIS — R569 Unspecified convulsions: Secondary | ICD-10-CM | POA: Diagnosis present

## 2017-04-23 DIAGNOSIS — M791 Myalgia: Secondary | ICD-10-CM | POA: Insufficient documentation

## 2017-04-23 DIAGNOSIS — R0602 Shortness of breath: Secondary | ICD-10-CM | POA: Insufficient documentation

## 2017-04-23 DIAGNOSIS — M542 Cervicalgia: Secondary | ICD-10-CM | POA: Insufficient documentation

## 2017-04-23 DIAGNOSIS — R0789 Other chest pain: Secondary | ICD-10-CM | POA: Diagnosis not present

## 2017-04-23 DIAGNOSIS — J9801 Acute bronchospasm: Secondary | ICD-10-CM | POA: Diagnosis not present

## 2017-04-23 DIAGNOSIS — Z79899 Other long term (current) drug therapy: Secondary | ICD-10-CM | POA: Diagnosis not present

## 2017-04-23 DIAGNOSIS — R110 Nausea: Secondary | ICD-10-CM | POA: Diagnosis not present

## 2017-04-23 DIAGNOSIS — R51 Headache: Secondary | ICD-10-CM | POA: Diagnosis not present

## 2017-04-23 LAB — CBC WITH DIFFERENTIAL/PLATELET
BASOS PCT: 0 %
Basophils Absolute: 0 10*3/uL (ref 0.0–0.1)
EOS PCT: 6 %
Eosinophils Absolute: 0.4 10*3/uL (ref 0.0–0.7)
HCT: 36.4 % (ref 36.0–46.0)
HEMOGLOBIN: 11.7 g/dL — AB (ref 12.0–15.0)
LYMPHS PCT: 25 %
Lymphs Abs: 1.7 10*3/uL (ref 0.7–4.0)
MCH: 22.2 pg — ABNORMAL LOW (ref 26.0–34.0)
MCHC: 32.1 g/dL (ref 30.0–36.0)
MCV: 68.9 fL — AB (ref 78.0–100.0)
MONO ABS: 0.5 10*3/uL (ref 0.1–1.0)
MONOS PCT: 7 %
NEUTROS PCT: 62 %
Neutro Abs: 4.1 10*3/uL (ref 1.7–7.7)
PLATELETS: 216 10*3/uL (ref 150–400)
RBC: 5.28 MIL/uL — AB (ref 3.87–5.11)
RDW: 15.4 % (ref 11.5–15.5)
WBC: 6.7 10*3/uL (ref 4.0–10.5)

## 2017-04-23 LAB — URINALYSIS, ROUTINE W REFLEX MICROSCOPIC
BILIRUBIN URINE: NEGATIVE
Glucose, UA: NEGATIVE mg/dL
Hgb urine dipstick: NEGATIVE
Ketones, ur: NEGATIVE mg/dL
LEUKOCYTES UA: NEGATIVE
NITRITE: NEGATIVE
PH: 7 (ref 5.0–8.0)
Protein, ur: NEGATIVE mg/dL
SPECIFIC GRAVITY, URINE: 1.009 (ref 1.005–1.030)

## 2017-04-23 LAB — COMPREHENSIVE METABOLIC PANEL
ALT: 61 U/L — ABNORMAL HIGH (ref 14–54)
ANION GAP: 6 (ref 5–15)
AST: 74 U/L — AB (ref 15–41)
Albumin: 3.9 g/dL (ref 3.5–5.0)
Alkaline Phosphatase: 105 U/L (ref 38–126)
BILIRUBIN TOTAL: 0.9 mg/dL (ref 0.3–1.2)
BUN: 11 mg/dL (ref 6–20)
CHLORIDE: 106 mmol/L (ref 101–111)
CO2: 27 mmol/L (ref 22–32)
Calcium: 9 mg/dL (ref 8.9–10.3)
Creatinine, Ser: 0.88 mg/dL (ref 0.44–1.00)
GFR calc Af Amer: >60 mL/min (ref >60)
Glucose, Bld: 96 mg/dL (ref 65–99)
POTASSIUM: 4.9 mmol/L (ref 3.5–5.1)
Sodium: 139 mmol/L (ref 135–145)
TOTAL PROTEIN: 7.6 g/dL (ref 6.5–8.1)

## 2017-04-23 LAB — I-STAT BETA HCG BLOOD, ED (MC, WL, AP ONLY)

## 2017-04-23 LAB — URINE CULTURE

## 2017-04-23 LAB — RAPID URINE DRUG SCREEN, HOSP PERFORMED
Amphetamines: NOT DETECTED
Barbiturates: POSITIVE — AB
Benzodiazepines: NOT DETECTED
COCAINE: NOT DETECTED
OPIATES: NOT DETECTED
Tetrahydrocannabinol: POSITIVE — AB

## 2017-04-23 LAB — D-DIMER, QUANTITATIVE: D-Dimer, Quant: 0.41 ug/mL-FEU (ref 0.00–0.50)

## 2017-04-23 LAB — LIPASE, BLOOD: Lipase: 20 U/L (ref 11–51)

## 2017-04-23 LAB — I-STAT TROPONIN, ED: TROPONIN I, POC: 0 ng/mL (ref 0.00–0.08)

## 2017-04-23 LAB — CBG MONITORING, ED: GLUCOSE-CAPILLARY: 107 mg/dL — AB (ref 65–99)

## 2017-04-23 LAB — ETHANOL

## 2017-04-23 MED ORDER — MORPHINE SULFATE (PF) 4 MG/ML IV SOLN
4.0000 mg | Freq: Once | INTRAVENOUS | Status: AC
  Administered 2017-04-23: 4 mg via INTRAVENOUS
  Filled 2017-04-23: qty 1

## 2017-04-23 MED ORDER — ALBUTEROL SULFATE (2.5 MG/3ML) 0.083% IN NEBU
5.0000 mg | INHALATION_SOLUTION | Freq: Once | RESPIRATORY_TRACT | Status: AC
  Administered 2017-04-23: 5 mg via RESPIRATORY_TRACT
  Filled 2017-04-23: qty 6

## 2017-04-23 MED ORDER — KETOROLAC TROMETHAMINE 30 MG/ML IJ SOLN
30.0000 mg | Freq: Once | INTRAMUSCULAR | Status: AC
  Administered 2017-04-23: 30 mg via INTRAVENOUS
  Filled 2017-04-23: qty 1

## 2017-04-23 MED ORDER — ONDANSETRON HCL 4 MG/2ML IJ SOLN
4.0000 mg | Freq: Once | INTRAMUSCULAR | Status: AC
  Administered 2017-04-23: 4 mg via INTRAVENOUS
  Filled 2017-04-23: qty 2

## 2017-04-23 NOTE — ED Notes (Signed)
Bed: WA17 Expected date:  Expected time:  Means of arrival:  Comments: 36 yo Seizure

## 2017-04-23 NOTE — ED Notes (Signed)
Patient aware of urine sample. Said she would void when able.

## 2017-04-23 NOTE — ED Provider Notes (Signed)
[facility]-EMERGENCY DEPT Provider Note   CSN: [id number] Arrival date & time: 04/23/17  1850     History   Chief Complaint Chief Complaint  Patient presents with  . Seizures    HPI Joann Newman is a 36 y.o. female.  HPI Patient with multiple presentations to emergency department for similar symptoms. Had witnessed seizure-like activity prior to arrival. States she had one shaking episode that lasted about 30 minutes and another that was about 10 minutes. Patient had incontinence but denies any tongue biting. Patient states she's now has throbbing headache, nausea, shortness of breath and left-sided chest pain. Pain is sharp and worse with deep breathing. She's also had nonproductive cough. No fever or chills. Says she's been compliant with her medications. Planning on going back to [place] Friday or Saturday. Past Medical History:  Diagnosis Date  . Seizures (HCC)     There are no active problems to display for this patient.   History reviewed. No pertinent surgical history.  OB History    No data available       Home Medications    Prior to Admission medications   Medication Sig Start Date End Date Taking? Authorizing Provider  albuterol (PROVENTIL HFA;VENTOLIN HFA) 108 (90 Base) MCG/ACT inhaler Inhale 2 puffs into the lungs every 6 (six) hours as needed for wheezing or shortness of breath.   Yes [provider]  amitriptyline (ELAVIL) 25 MG tablet Take 50 mg by mouth at bedtime.   Yes [provider]  citalopram (CELEXA) 40 MG tablet Take 40 mg by mouth daily.    Yes [provider]  cyclobenzaprine (FLEXERIL) 10 MG tablet Take 1 tablet (10 mg total) by mouth 3 (three) times daily as needed for muscle spasms. #30 filled 03/05/17 04/14/17  Yes [name], MD  diclofenac (VOLTAREN) 50 MG EC tablet Take 1 tablet (50 mg total) by mouth every 8 (eight) hours as needed (pain). 04/14/17  Yes [name], MD  diphenhydrAMINE (BENADRYL) 25 MG tablet  Take 25 mg by mouth every 6 (six) hours as needed for itching.   Yes [provider]  furosemide (LASIX) 20 MG tablet Take 1 tablet (20 mg total) by mouth daily. 04/14/17  Yes [name], MD  gabapentin (NEURONTIN) 300 MG capsule Take 300 mg by mouth 3 (three) times daily.   Yes [provider]  lacosamide (VIMPAT) 200 MG TABS tablet Take 200 mg by mouth 2 (two) times daily.   Yes [provider]  levETIRAcetam (KEPPRA) 1000 MG tablet Take 2,000 mg by mouth 2 (two) times daily.   Yes [provider]  oxyCODONE-acetaminophen (PERCOCET/ROXICET) 5-325 MG tablet Take 0.5-1 tablets by mouth every 6 (six) hours as needed (pain). 04/14/17  Yes [name], MD  rOPINIRole (REQUIP) 1 MG tablet Take 1 mg by mouth at bedtime.   Yes [provider]  VITAMIN E SKIN OIL Apply 1 application topically daily.   Yes [provider]  ibuprofen (ADVIL,MOTRIN) 600 MG tablet Take 1 tablet (600 mg total) by mouth 3 (three) times daily with meals as needed for headache or moderate pain (pain). 04/24/17   Loren Racer, MD  metoCLOPramide (REGLAN) 10 MG tablet Take 1 tablet (10 mg total) by mouth every 6 (six) hours as needed for nausea (nausea/headache). 04/23/17   Loren Racer, MD  PHENobarbital (LUMINAL) 64.8 MG tablet Take 1 tablet (64.8 mg total) by mouth See admin instructions. Take 1 tablet (64.8 mg) by mouth twice daily - morning and noon 04/14/17  Dione BoozeGlick, Alfio Loescher, MD  predniSONE (DELTASONE) 50 MG tablet Take 1 tablet (50 mg total) by mouth daily. 04/23/17   Loren RacerYelverton, Zakhari Fogel, MD  topiramate (TOPAMAX) 50 MG tablet Take 1.5 tablets (75 mg total) by mouth 2 (two) times daily. 04/23/17   Loren RacerYelverton, Aadarsh Cozort, MD    Family History History reviewed. No pertinent family history.  Social History Social History  Substance Use Topics  . Smoking status: Never Smoker  . Smokeless tobacco: Never Used  . Alcohol use Not on file     Allergies   Bactrim  [sulfamethoxazole-trimethoprim]; Ceftin [cefuroxime axetil]; Coconut flavor; Mushroom extract complex; and Pomegranate [punica]   Review of Systems Review of Systems  Constitutional: Negative for chills, fatigue and fever.  HENT: Negative for facial swelling, sore throat and trouble swallowing.   Eyes: Negative for visual disturbance.  Respiratory: Positive for cough, shortness of breath and wheezing.   Cardiovascular: Positive for chest pain. Negative for palpitations and leg swelling.  Gastrointestinal: Negative for abdominal pain, constipation, diarrhea and vomiting.  Genitourinary: Negative for dysuria, flank pain and frequency.  Musculoskeletal: Positive for myalgias and neck pain. Negative for arthralgias, back pain, joint swelling and neck stiffness.  Skin: Negative for rash and wound.  Neurological: Positive for seizures and headaches. Negative for dizziness, tremors, weakness, light-headedness and numbness.  All other systems reviewed and are negative.    Physical Exam Updated Vital Signs BP 106/77 (BP Location: Right Arm)   Pulse 84   Temp 97.9 F (36.6 C) (Oral)   Resp (!) 21   Ht 5\' 2"  (1.575 m)   Wt 124.7 kg (275 lb)   LMP 03/31/2017 (Approximate)   SpO2 94%   BMI 50.30 kg/m   Physical Exam  Constitutional: She is oriented to person, place, and time. She appears well-developed and well-nourished. No distress.  HENT:  Head: Normocephalic and atraumatic.  Mouth/Throat: Oropharynx is clear and moist.  No intraoral trauma. No malocclusion. No obvious head injury.  Eyes: EOM are normal. Pupils are equal, round, and reactive to light.  Neck: Normal range of motion. Neck supple.  Mild diffuse cervical paraspinal tenderness without focal midline tenderness. No meningismus.  Cardiovascular: Normal rate, regular rhythm, normal heart sounds and intact distal pulses.  Exam reveals no gallop and no friction rub.   No murmur heard. Pulmonary/Chest: Effort normal. She has  wheezes. She exhibits tenderness.  Patient with diminished breath sounds throughout and expiratory wheezes bilaterally. Does not appear to be in any respiratory distress. Chest tenderness is reproduced with palpation over the left sternal border. There is no crepitance or deformity.  Abdominal: Soft. Bowel sounds are normal. There is no tenderness. There is no rebound and no guarding.  Musculoskeletal: Normal range of motion. She exhibits no edema or tenderness.  No lower extremity swelling, asymmetry or tenderness. Distal pulses are 2+. No midline thoracic or lumbar tenderness. No CVA tenderness bilaterally.  Neurological: She is alert and oriented to person, place, and time.  Patient is alert and oriented x3 with clear, goal oriented speech. Patient has 5/5 motor in all extremities. Sensation is intact to light touch. Bilateral finger-to-nose is normal with no signs of dysmetria.   Skin: Skin is warm and dry. Capillary refill takes less than 2 seconds. No rash noted. She is not diaphoretic. No erythema.  Psychiatric: She has a normal mood and affect. Her behavior is normal.  Nursing note and vitals reviewed.    ED Treatments / Results  Labs (all labs ordered are listed, but only  abnormal results are displayed) Labs Reviewed  COMPREHENSIVE METABOLIC PANEL - Abnormal; Notable for the following:       Result Value   AST 74 (*)    ALT 61 (*)    All other components within normal limits  RAPID URINE DRUG SCREEN, HOSP PERFORMED - Abnormal; Notable for the following:    Tetrahydrocannabinol POSITIVE (*)    Barbiturates POSITIVE (*)    All other components within normal limits  CBC WITH DIFFERENTIAL/PLATELET - Abnormal; Notable for the following:    RBC 5.28 (*)    Hemoglobin 11.7 (*)    MCV 68.9 (*)    MCH 22.2 (*)    All other components within normal limits  CBG MONITORING, ED - Abnormal; Notable for the following:    Glucose-Capillary 107 (*)    All other components within normal  limits  ETHANOL  D-DIMER, QUANTITATIVE (NOT AT [facility])  URINALYSIS, ROUTINE W REFLEX MICROSCOPIC  LIPASE, BLOOD  CBC WITH DIFFERENTIAL/PLATELET  I-STAT BETA HCG BLOOD, ED ([facility], [facility], [facility] ONLY)  I-STAT TROPOININ, ED    EKG  EKG Interpretation  Date/Time:  Tuesday April 23 2017 20:09:21 EDT Ventricular Rate:  82 PR Interval:    QRS Duration: 94 QT Interval:  374 QTC Calculation: 437 R Axis:   44 Text Interpretation:  Sinus rhythm Nonspecific T abnrm, anterolateral leads Confirmed by Ranae Palms  MD, Kainoah Bartosiewicz ([id number]) on 04/23/2017 10:56:35 PM       Radiology Dg Chest 2 View  Result Date: 04/23/2017 CLINICAL DATA:  Chest pain EXAM: CHEST  2 VIEW COMPARISON:  04/22/2017 FINDINGS: Low lung volumes. Borderline cardiomegaly. No consolidation or effusion. No pneumothorax. IMPRESSION: No active cardiopulmonary disease. Electronically Signed   By: Jasmine Pang M.D.   On: 04/23/2017 20:06   Dg Chest 2 View  Result Date: 04/22/2017 CLINICAL DATA:  36 year old female with chest pain. EXAM: CHEST  2 VIEW COMPARISON:  Chest radiograph dated 04/14/2017 FINDINGS: Shallow inspiration. No focal consolidation, pleural effusion, or pneumothorax. The cardiac silhouette is within normal limits. No acute osseous pathology. IMPRESSION: No active cardiopulmonary disease. Electronically Signed   By: Elgie Collard M.D.   On: 04/22/2017 00:30    Procedures Procedures (including critical care time)  Medications Ordered in ED Medications  levETIRAcetam (KEPPRA) tablet 2,000 mg (not administered)  diphenhydrAMINE (BENADRYL) capsule 25 mg (not administered)  albuterol (PROVENTIL) (2.5 MG/3ML) 0.083% nebulizer solution 5 mg (5 mg Nebulization Given 04/23/17 2052)  ketorolac (TORADOL) 30 MG/ML injection 30 mg (30 mg Intravenous Given 04/23/17 2131)  morphine 4 MG/ML injection 4 mg (4 mg Intravenous Given 04/23/17 2256)  ondansetron (ZOFRAN) injection 4 mg (4 mg Intravenous Given 04/23/17 2256)     Initial Impression  / Assessment and Plan / ED Course  I have reviewed the triage vital signs and the nursing notes.  Pertinent labs & imaging results that were available during my care of the patient were reviewed by me and considered in my medical decision making (see chart for details).     Remains hemodynamically stable in the emergency department. Normal neurologic exam. Discussed with Dr. Amada Jupiter. Does not believe the patient will benefit from admission. Suggest increasing her Topamax dose to 75 mg twice a day and having her follow-up with her neurologist when she goes back to [place]. No evidence of retained stone on ultrasound. Patient states she is feeling much better. Given dose of her oral Keppra in the emergency department. We'll give a short course of steroids for bronchospasms and possible pleurisy.  Patient will follow with her neurologist when she returns to [place]. Return precautions have been given. Final Clinical Impressions(s) / ED Diagnoses   Final diagnoses:  Seizure-like activity (HCC)  Chest wall pain  Bronchospasm    New Prescriptions New Prescriptions   METOCLOPRAMIDE (REGLAN) 10 MG TABLET    Take 1 tablet (10 mg total) by mouth every 6 (six) hours as needed for nausea (nausea/headache).   PREDNISONE (DELTASONE) 50 MG TABLET    Take 1 tablet (50 mg total) by mouth daily.     Loren Racer, MD 04/24/17 [phone]

## 2017-04-23 NOTE — ED Triage Notes (Addendum)
Per EMS, pt is coming from home after experiencing a seizure. Daughter reported to EMS that pt had 4 seizures prior to their arrival each lasting 5 minutes. Per EMS pt was post ictal for approx. 3 minutes. Per EMS pt is currently AO x4. Pt has complaints of headache, chest wall pain, weakness, and nausea. Pt received 4 mg zofran ODT from EMS.

## 2017-04-24 ENCOUNTER — Telehealth: Payer: Self-pay | Admitting: Emergency Medicine

## 2017-04-24 MED ORDER — DIPHENHYDRAMINE HCL 25 MG PO CAPS
25.0000 mg | ORAL_CAPSULE | Freq: Once | ORAL | Status: AC
  Administered 2017-04-24: 25 mg via ORAL
  Filled 2017-04-24: qty 1

## 2017-04-24 MED ORDER — PREDNISONE 50 MG PO TABS: 50.0000 mg | ORAL_TABLET | Freq: Every day | ORAL | 0 refills | Status: AC

## 2017-04-24 MED ORDER — METOCLOPRAMIDE HCL 10 MG PO TABS: 10.0000 mg | ORAL_TABLET | Freq: Four times a day (QID) | ORAL | 0 refills | Status: AC | PRN

## 2017-04-24 MED ORDER — TOPIRAMATE 50 MG PO TABS
75.0000 mg | ORAL_TABLET | Freq: Two times a day (BID) | ORAL | 0 refills | Status: AC
Start: 2017-04-23 — End: ?

## 2017-04-24 MED ORDER — IBUPROFEN 600 MG PO TABS: 600.0000 mg | ORAL_TABLET | Freq: Three times a day (TID) | ORAL | 0 refills | Status: AC | PRN

## 2017-04-24 MED ORDER — LEVETIRACETAM 500 MG PO TABS
2000.0000 mg | ORAL_TABLET | Freq: Once | ORAL | Status: AC
  Administered 2017-04-24: 2000 mg via ORAL
  Filled 2017-04-24: qty 4

## 2017-04-24 NOTE — Telephone Encounter (Signed)
Post ED Visit - Positive Culture Follow-up  Culture report reviewed by antimicrobial stewardship pharmacist:  []  Enzo BiNathan Batchelder, Pharm.D. []  Celedonio MiyamotoJeremy Frens, Pharm.D., BCPS AQ-ID []  Garvin FilaMike Maccia, Pharm.D., BCPS []  Georgina PillionElizabeth Martin, Pharm.D., BCPS []  [place] Pham, [place]arm.D., BCPS, AAHIVP []  Estella HuskMichelle Turner, Pharm.D., BCPS, AAHIVP []  Lysle Pearlachel Rumbarger, PharmD, BCPS []  Casilda Carlsaylor Stone, PharmD, BCPS []  Pollyann SamplesAndy Johnston, PharmD, BCPS  Positive urine culture Treated with fluconazole, organism sensitive to the same and no further patient follow-up is required at this time.  Berle MullMiller, Orra Nolde 04/24/2017, 4:42 PM

## 2019-02-03 IMAGING — CR DG CHEST 2V
2 series · 2 of 2 positions shown · non-contrast
Comparison: 04/22/2017

CLINICAL DATA: Chest pain

EXAM:
CHEST  2 VIEW

[w chest lat]
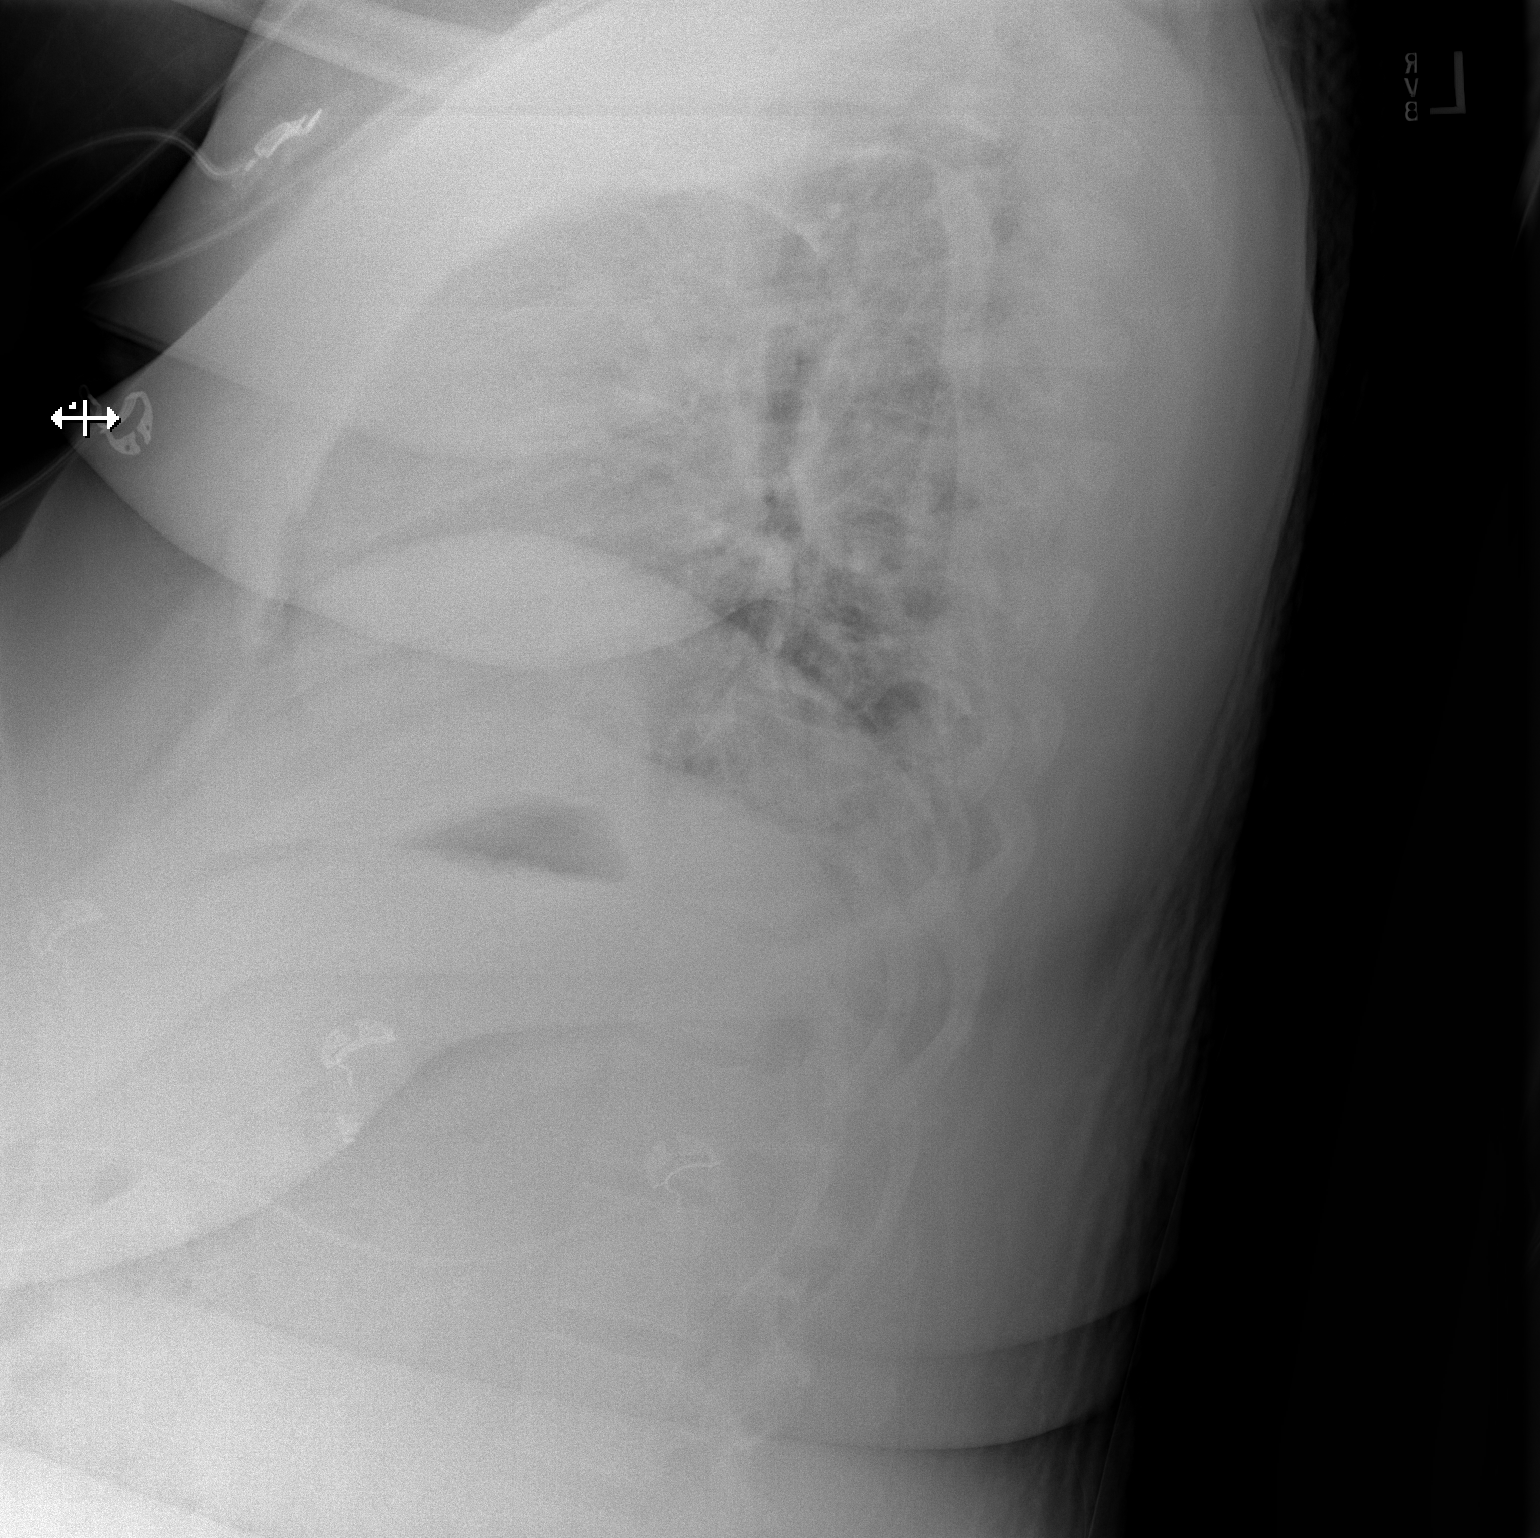

[x chest ap]
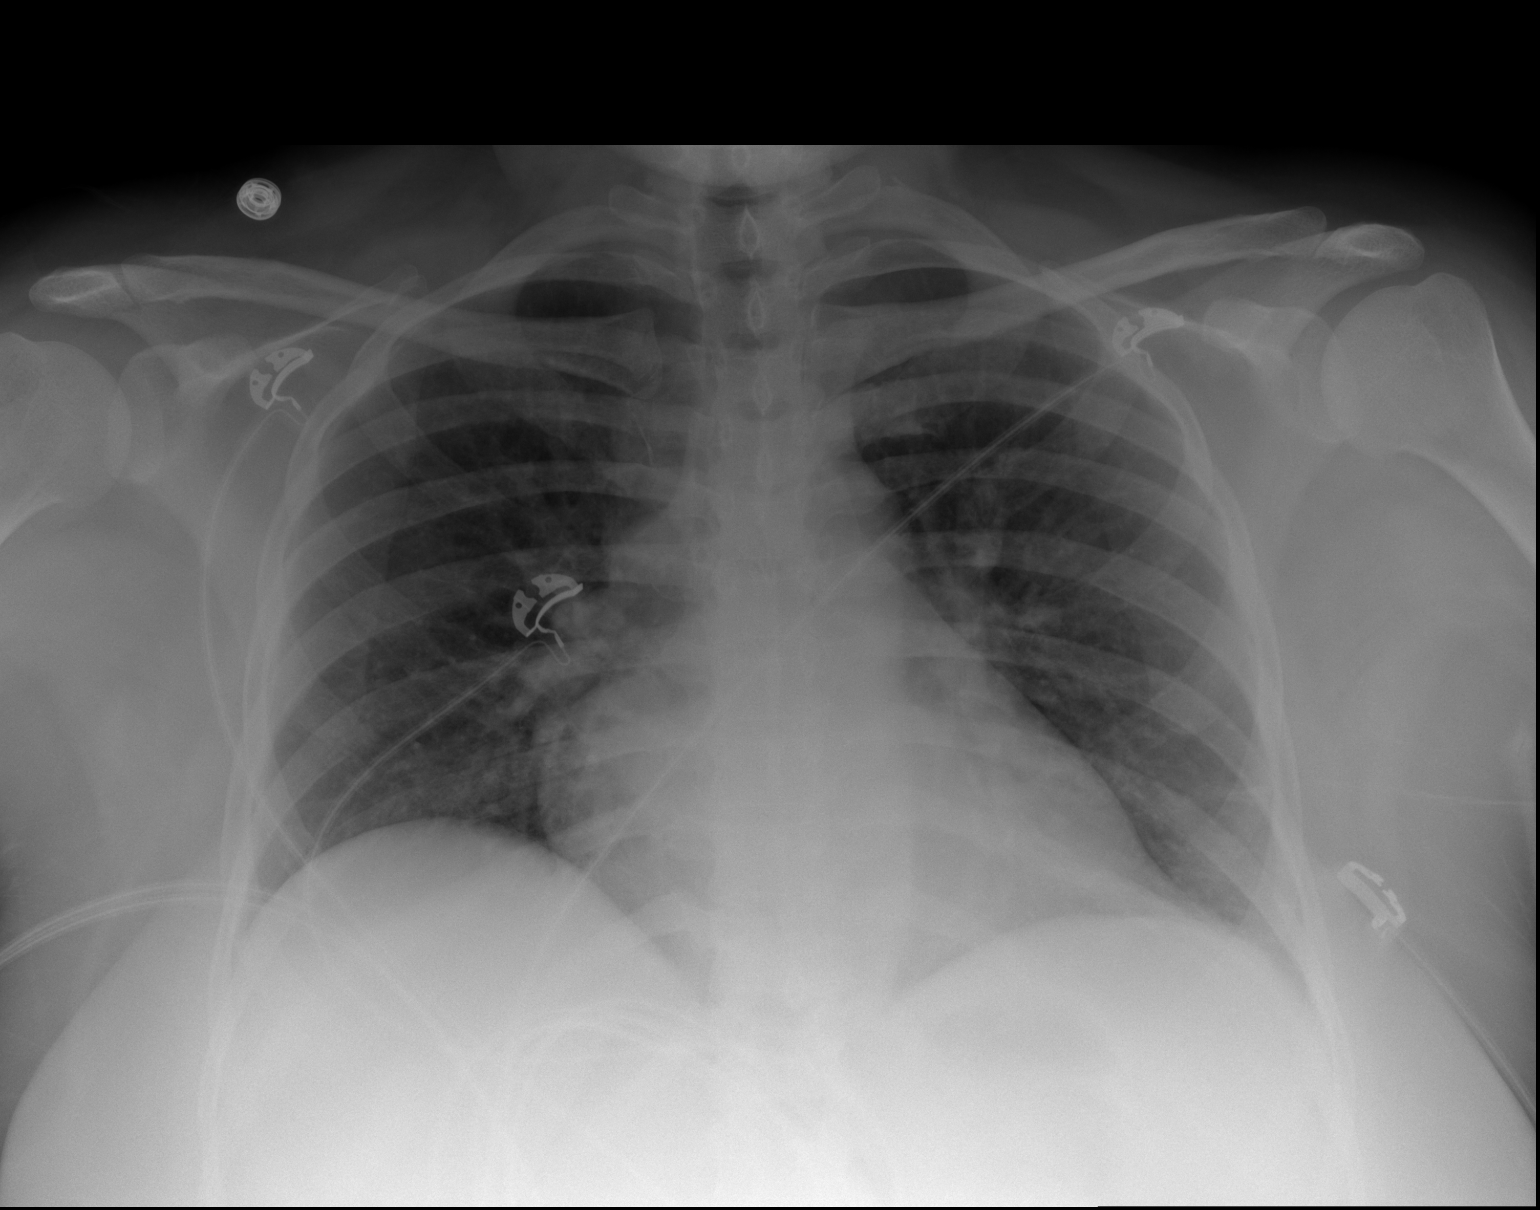

[2 of 2 positions shown; findings below may reference images not displayed]

FINDINGS: Low lung volumes. Borderline cardiomegaly. No consolidation or
effusion. No pneumothorax.
IMPRESSION: No active cardiopulmonary disease.
